# Patient Record
Sex: Female | Born: 1950 | Race: Asian | Hispanic: No | Marital: Single | Smoking: Former smoker
Health system: Southern US, Community
[De-identification: ages and names within clinical notes are randomized; demographics above are authoritative.]

## PROBLEM LIST (undated history)

## (undated) DIAGNOSIS — F32A Depression, unspecified: Secondary | ICD-10-CM

## (undated) DIAGNOSIS — H269 Unspecified cataract: Secondary | ICD-10-CM

## (undated) DIAGNOSIS — M810 Age-related osteoporosis without current pathological fracture: Secondary | ICD-10-CM

## (undated) DIAGNOSIS — F329 Major depressive disorder, single episode, unspecified: Secondary | ICD-10-CM

## (undated) DIAGNOSIS — I10 Essential (primary) hypertension: Secondary | ICD-10-CM

## (undated) HISTORY — DX: Essential (primary) hypertension: I10

## (undated) HISTORY — DX: Depression, unspecified: F32.A

## (undated) HISTORY — DX: Unspecified cataract: H26.9

## (undated) HISTORY — DX: Age-related osteoporosis without current pathological fracture: M81.0

## (undated) HISTORY — DX: Major depressive disorder, single episode, unspecified: F32.9

---

## 2013-11-26 ENCOUNTER — Ambulatory Visit (INDEPENDENT_AMBULATORY_CARE_PROVIDER_SITE_OTHER): Payer: BC Managed Care – PPO | Admitting: Podiatry

## 2013-11-26 ENCOUNTER — Encounter: Payer: Self-pay | Admitting: Podiatry

## 2013-11-26 VITALS — BP 149/100 | HR 82 | Ht <= 58 in | Wt 133.0 lb

## 2013-11-26 DIAGNOSIS — S99919A Unspecified injury of unspecified ankle, initial encounter: Secondary | ICD-10-CM

## 2013-11-26 DIAGNOSIS — M659 Synovitis and tenosynovitis, unspecified: Secondary | ICD-10-CM

## 2013-11-26 DIAGNOSIS — L4050 Arthropathic psoriasis, unspecified: Secondary | ICD-10-CM

## 2013-11-26 DIAGNOSIS — S8990XA Unspecified injury of unspecified lower leg, initial encounter: Secondary | ICD-10-CM

## 2013-11-26 DIAGNOSIS — L409 Psoriasis, unspecified: Secondary | ICD-10-CM | POA: Insufficient documentation

## 2013-11-26 DIAGNOSIS — S99929A Unspecified injury of unspecified foot, initial encounter: Secondary | ICD-10-CM

## 2013-11-26 MED ORDER — TRIAMCINOLONE 0.1 % CREAM:EUCERIN CREAM 1:1: 1.0000 "application " | TOPICAL_CREAM | Freq: Two times a day (BID) | CUTANEOUS | Status: DC

## 2013-11-26 NOTE — Patient Instructions (Signed)
Seen for injury on right foot and ankle. No sign of acute injury in X-ray.  Noted of osteoporosis and arthritic change. Findings discusses.

## 2013-11-26 NOTE — Progress Notes (Signed)
Subjective: Slipped and fell on 11/22/13 while cleaning her car. Right foot turned over and swollen. Was not able to walk immediately after the incident for a few days. Used Ice pack that brought down swelling.  Today she is able to walk.   Objective: Swollen foot and ankle right. Positive pain at the first MPJ, chronic.  Pain is at posterior lateral aspect of right lateral malleoli.  X-ray reveal no acute change in right and left. 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  High arched cavus foot.  Dry yellow crusted.  Assessment: Psoriatic arthritis by history. Dermatitis plantar surface bilateral. Arthropathy first MPJ bilateral.   Plan:

## 2014-08-05 ENCOUNTER — Ambulatory Visit: Payer: Self-pay | Admitting: Internal Medicine

## 2014-09-02 ENCOUNTER — Telehealth: Payer: Self-pay | Admitting: *Deleted

## 2014-09-02 MED ORDER — HYDROCODONE-IBUPROFEN 7.5-200 MG PO TABS: 1.0000 | ORAL_TABLET | Freq: Three times a day (TID) | ORAL | Status: DC | PRN

## 2014-09-02 MED ORDER — CYCLOBENZAPRINE HCL 10 MG PO TABS: 10.0000 mg | ORAL_TABLET | Freq: Three times a day (TID) | ORAL | Status: DC | PRN

## 2014-09-02 MED ORDER — NABUMETONE 500 MG PO TABS: 500.0000 mg | ORAL_TABLET | Freq: Two times a day (BID) | ORAL | Status: DC

## 2014-09-02 NOTE — Telephone Encounter (Signed)
Patient requests pain medication.

## 2014-09-09 ENCOUNTER — Ambulatory Visit: Payer: Self-pay | Admitting: Internal Medicine

## 2014-10-14 ENCOUNTER — Ambulatory Visit: Payer: Self-pay | Admitting: Medical

## 2014-10-21 ENCOUNTER — Telehealth: Payer: Self-pay | Admitting: Medical

## 2014-10-21 ENCOUNTER — Ambulatory Visit (HOSPITAL_BASED_OUTPATIENT_CLINIC_OR_DEPARTMENT_OTHER)
Admission: RE | Admit: 2014-10-21 | Discharge: 2014-10-21 | Disposition: A | Discharge status: Home / Self Care | Payer: 59 | Source: Ambulatory Visit | Attending: Medical | Admitting: Medical

## 2014-10-21 ENCOUNTER — Ambulatory Visit: Payer: Self-pay | Admitting: Internal Medicine

## 2014-10-21 ENCOUNTER — Ambulatory Visit (INDEPENDENT_AMBULATORY_CARE_PROVIDER_SITE_OTHER): Payer: 59 | Admitting: Medical

## 2014-10-21 ENCOUNTER — Encounter: Payer: Self-pay | Admitting: Medical

## 2014-10-21 VITALS — BP 163/90 | HR 106 | Temp 98.3°F | Ht 60.0 in | Wt 129.2 lb

## 2014-10-21 DIAGNOSIS — M25561 Pain in right knee: Secondary | ICD-10-CM | POA: Diagnosis present

## 2014-10-21 DIAGNOSIS — G8929 Other chronic pain: Secondary | ICD-10-CM

## 2014-10-21 DIAGNOSIS — I10 Essential (primary) hypertension: Secondary | ICD-10-CM

## 2014-10-21 LAB — CBC WITH DIFFERENTIAL/PLATELET
BASOS PCT: 0.4 % (ref 0.0–3.0)
Basophils Absolute: 0 10*3/uL (ref 0.0–0.1)
Eosinophils Absolute: 0.1 10*3/uL (ref 0.0–0.7)
Eosinophils Relative: 1 % (ref 0.0–5.0)
HEMATOCRIT: 42.8 % (ref 36.0–46.0)
HEMOGLOBIN: 14.7 g/dL (ref 12.0–15.0)
Lymphocytes Relative: 34.7 % (ref 12.0–46.0)
Lymphs Abs: 3.2 10*3/uL (ref 0.7–4.0)
MCHC: 34.2 g/dL (ref 30.0–36.0)
MCV: 91.5 fl (ref 78.0–100.0)
MONOS PCT: 4.8 % (ref 3.0–12.0)
Monocytes Absolute: 0.4 10*3/uL (ref 0.1–1.0)
Neutro Abs: 5.4 10*3/uL (ref 1.4–7.7)
Neutrophils Relative %: 59.1 % (ref 43.0–77.0)
Platelets: 363 10*3/uL (ref 150.0–400.0)
RBC: 4.68 Mil/uL (ref 3.87–5.11)
RDW: 13.1 % (ref 11.5–15.5)
WBC: 9.2 10*3/uL (ref 4.0–10.5)

## 2014-10-21 LAB — COMPREHENSIVE METABOLIC PANEL
ALK PHOS: 147 U/L — AB (ref 39–117)
ALT: 55 U/L — ABNORMAL HIGH (ref 0–35)
AST: 34 U/L (ref 0–37)
Albumin: 4.2 g/dL (ref 3.5–5.2)
BUN: 9 mg/dL (ref 6–23)
CO2: 27 mEq/L (ref 19–32)
Calcium: 9.9 mg/dL (ref 8.4–10.5)
Chloride: 102 mEq/L (ref 96–112)
Creatinine, Ser: 0.54 mg/dL (ref 0.40–1.20)
GFR: 121.7 mL/min (ref >60.00)
Glucose, Bld: 127 mg/dL — ABNORMAL HIGH (ref 70–99)
Potassium: 3.9 mEq/L (ref 3.5–5.1)
SODIUM: 139 meq/L (ref 135–145)
TOTAL PROTEIN: 8 g/dL (ref 6.0–8.3)
Total Bilirubin: 0.5 mg/dL (ref 0.2–1.2)

## 2014-10-21 MED ORDER — LISINOPRIL 10 MG PO TABS: 10.0000 mg | ORAL_TABLET | Freq: Every day | ORAL | Status: DC

## 2014-10-21 NOTE — Patient Instructions (Addendum)
For your htn, I am prescribing lisinopril and getting labs today.  For your knee pain rt side, I will get xray today. Since you pain is very minimal now I  would only recommend low dose alleve otc 1 tablet twice if pain were to worsen. Stronger med on follow up may be written but want to know your bp is controlled better before prescribing.  Follow up in 2 weeks for blood pressure check and discuss how your knee feels. May give med for knee pain on follow up.

## 2014-10-21 NOTE — Assessment & Plan Note (Signed)
For your htn, I am prescribing lisinopril and getting labs today.

## 2014-10-21 NOTE — Progress Notes (Signed)
Pre visit review using our clinic review tool, if applicable. No additional management support is needed unless otherwise documented below in the visit note. 

## 2014-10-21 NOTE — Telephone Encounter (Signed)
I have reviewed pt labs. Mild abnormality. Will review with her when her translator is in. Pt speaks [place]. When she is in for follow up will discuss labs and x-rays with pt.

## 2014-10-21 NOTE — Progress Notes (Signed)
Subjective:    Patient ID: Barbara Buchanan, female    DOB: [birth year], 64 y.o.   MRN: [id number]  HPI   I have reviewed pt PMH, PSH, FH, Social History and Surgical History.(pt friend translates). Speaks [place].  Depression- hx of. She states it is controlled. Had that years ago. 2 yrs ago.  Htn- BP  high today. She never been on medication for bp before.  BP at podiatrist was 149/100. No cardiac or neurologic signs or symptoms.   Osteoporosis- Pt not aware of any medications in the past.  No history of surgeries before.  Former smoker- quit 5 yrs ago. 2 pack a day before she quit.  Pt drinks some alcohol. Hard  about twice a week. Very rare now . 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 Pt states [facility]. Then she saw orhtopedist . They gave her a injection in knee. She did Jalana with minimal to no pain up to 1 wk ago when pain seems to restart. The pain is much less than before.    Pt not taking any rx medications know.  Past Medical History  Diagnosis Date  . Cataract   . Depression   . Hypertension   . Osteoporosis     History   Social History  . Marital Status: Single    Spouse Name: N/A    Number of Children: N/A  . Years of Education: N/A   Occupational History  . Not on file.   Social History Main Topics  . Smoking status: Former Smoker    Quit date: 10/21/2009  . Smokeless tobacco: Never Used  . Alcohol Use: Yes  . Drug Use: No  . Sexual Activity: Not on file   Other Topics Concern  . Not on file   Social History Narrative    No past surgical history on file.  Family History  Problem Relation Age of Onset  . Stroke Mother     Allergies  Allergen Reactions  . Penicillins     No current outpatient prescriptions on file prior to visit.   No current  facility-administered medications on file prior to visit.    BP 163/90 mmHg  Pulse 106  Temp(Src) 98.3 F (36.8 C) (Oral)  Ht 5' (1.524 m)  Wt 129 lb 3.2 oz (58.605 kg)  BMI 25.23 kg/m2  SpO2 95%          Review of Systems  Constitutional: Negative for fever, chills, diaphoresis, activity change and fatigue.  Respiratory: Negative for cough, chest tightness and shortness of breath.   Cardiovascular: Negative for chest pain, palpitations and leg swelling.  Gastrointestinal: Negative for nausea, vomiting and abdominal pain.  Musculoskeletal: Negative for neck pain and neck stiffness.  Neurological: Negative for dizziness, tremors, seizures, syncope, facial asymmetry, speech difficulty, weakness, light-headedness, numbness and headaches.  Psychiatric/Behavioral: Negative for behavioral problems, confusion and agitation. The patient is not nervous/anxious.        Objective:   Physical Exam   General Mental Status- Alert. General Appearance- Not in acute distress.   Skin General: Color- Normal Color. Moisture- Normal Moisture.  Neck Carotid Arteries- Normal color. Moisture- Normal Moisture. No carotid bruits. No JVD.  Chest and Lung Exam Auscultation: Breath Sounds:-Normal.  Cardiovascular Auscultation:Rythm- Regular. Murmurs & Other Heart Sounds:Auscultation of the heart reveals- No  Murmurs.  Abdomen Inspection:-Inspeection Normal. Palpation/Percussion:Note:No mass. Palpation and Percussion of the abdomen reveal- Non Tender, Non Distended + BS, no rebound or guarding.    Neurologic Cranial Nerve exam:- CN III-XII intact(No nystagmus), symmetric smile. Romberg Exam:- Negative.  Heal to Toe Gait exam:-Normal. Strength:- 5/5 equal and symmetric strength both upper and lower extremities.  Rt and lt knee- neither has crepitus, swelling or tenderness on palpation or range of motion today.        Assessment & Plan:

## 2014-10-21 NOTE — Assessment & Plan Note (Signed)
For your knee pain rt side, I will get xray today. Since you pain is very minimal now I  would only recommend low dose alleve otc 1 tablet twice if pain were to worsen. Stronger med on follow up may be written but want to know your bp is controlled better before prescribing.

## 2014-11-04 ENCOUNTER — Telehealth: Payer: Self-pay | Admitting: Medical

## 2014-11-04 ENCOUNTER — Ambulatory Visit (INDEPENDENT_AMBULATORY_CARE_PROVIDER_SITE_OTHER): Payer: 59 | Admitting: Medical

## 2014-11-04 ENCOUNTER — Encounter: Payer: Self-pay | Admitting: Medical

## 2014-11-04 VITALS — BP 137/80 | HR 93 | Temp 97.7°F | Wt 128.8 lb

## 2014-11-04 DIAGNOSIS — R739 Hyperglycemia, unspecified: Secondary | ICD-10-CM

## 2014-11-04 DIAGNOSIS — M79671 Pain in right foot: Secondary | ICD-10-CM

## 2014-11-04 DIAGNOSIS — I10 Essential (primary) hypertension: Secondary | ICD-10-CM

## 2014-11-04 DIAGNOSIS — Z1211 Encounter for screening for malignant neoplasm of colon: Secondary | ICD-10-CM

## 2014-11-04 DIAGNOSIS — M25561 Pain in right knee: Secondary | ICD-10-CM

## 2014-11-04 DIAGNOSIS — R61 Generalized hyperhidrosis: Secondary | ICD-10-CM

## 2014-11-04 MED ORDER — LISINOPRIL 10 MG PO TABS: 10.0000 mg | ORAL_TABLET | Freq: Every day | ORAL | Status: DC

## 2014-11-04 NOTE — Assessment & Plan Note (Signed)
Intermittent episodes with subjective fever. No obvious infectious signs or symptoms. No recorded fever. Pt denies hot flash since went through menopause. I want her to get thermometer and check t with these events. Will refer her for colnoscopy. screening. Schedule wellness exam in 3-4 weeks. Get pap and order mammogram. Explained to pt and friend if indeed has fevers the will need to expand work up. Recent cbc and cmp looked [facility].

## 2014-11-04 NOTE — Telephone Encounter (Signed)
Pt prefers [facility] for gastro referral

## 2014-11-04 NOTE — Assessment & Plan Note (Signed)
a1c today 

## 2014-11-04 NOTE — Telephone Encounter (Signed)
Referral faxed to [facility]/awaiting appt °

## 2014-11-04 NOTE — Progress Notes (Signed)
Pre visit review using our clinic review tool, if applicable. No additional management support is needed unless otherwise documented below in the visit note. 

## 2014-11-04 NOTE — Patient Instructions (Addendum)
Essential hypertension I am pleased with reading I got today in light of prior reading. Will refill lisinopril at the same dose.    Knee pain, right Resolved.   Tenosynovitis of foot and ankle Pt states seen by podiatry. Known spur. Pain persists despite heel pad and meloxicam. I advised her to to see podiatrist again for possible procedure.   Diaphoresis Intermittent episodes with subjective fever. No obvious infectious signs or symptoms. No recorded fever. Pt denies hot flash since went through menopause. I want her to get thermometer and check t with these events. Will refer her for colnoscopy. screening. Schedule wellness exam in 3-4 weeks. Get pap and order mammogram. Explained to pt and friend if indeed has fevers the will need to expand work up. Recent cbc and cmp looked Barbara Buchanan.   Hyperglycemia a1-c today.   Folloq up 3-4 weeks wellness exam.

## 2014-11-04 NOTE — Progress Notes (Signed)
Subjective:    Patient ID: Barbara Buchanan, female    DOB: 12-12-1950, 64 y.o.   MRN: [id number]  HPI   Pt in for bp check. Pt is taking bp medication every day. Pt has not been checking her bp daily. No chest pain or neurologic signs or symptoms. No side effects from the lisinopril reported.  Pt no longer has knee pain. Rt knee pain resolved. But some heel pain about one week. No fall or trauma.  Also medial aspect of rt heal. Pt states foot Dr. Did xray of foot. Pt told she had spurr on xray. Pt was given meloxicam. Pt using heal pads.  Pt has some has acute sweat on and off 3- times a day. Pt states years ago went through menopause. This has been going on past month. No recorded fever and on review no infectious symptoms.   Refer to Gi.   Pt never had pap smear. No mammogram.     Review of Systems  Constitutional: Positive for diaphoresis. Negative for fever, chills, activity change and fatigue.  Respiratory: Negative for cough, chest tightness and shortness of breath.   Cardiovascular: Negative for chest pain, palpitations and leg swelling.  Gastrointestinal: Negative for nausea, vomiting and abdominal pain.  Musculoskeletal: Negative for neck pain and neck stiffness.       Rt knee pain resolved. Persistent rt heel pain.  Neurological: Negative for dizziness, tremors, seizures, syncope, facial asymmetry, speech difficulty, weakness, light-headedness, numbness and headaches.  Psychiatric/Behavioral: Negative for behavioral problems, confusion and agitation. The patient is not nervous/anxious.    Past Medical History  Diagnosis Date  . Cataract   . Depression   . Hypertension   . Osteoporosis     History   Social History  . Marital Status: Single    Spouse Name: N/A    Number of Children: N/A  . Years of Education: N/A   Occupational History  . Not on file.   Social History Main Topics  . Smoking status: Former Smoker    Quit date: 10/21/2009  . Smokeless tobacco: Never  Used  . Alcohol Use: Yes  . Drug Use: No  . Sexual Activity: Not on file   Other Topics Concern  . Not on file   Social History Narrative    No past surgical history on file.  Family History  Problem Relation Age of Onset  . Stroke Mother     Allergies  Allergen Reactions  . Penicillins     No current outpatient prescriptions on file prior to visit.   No current facility-administered medications on file prior to visit.    BP 137/80 mmHg  Pulse 93  Temp(Src) 97.7 F (36.5 C) (Oral)  Wt 128 lb 12.8 oz (58.423 kg)  SpO2 96%       Objective:   Physical Exam  General Mental Status- Alert. General Appearance- Not in acute distress.   Skin General: Color- Normal Color. Moisture- Normal Moisture.  Neck Carotid Arteries- Normal color. Moisture- Normal Moisture. No carotid bruits. No JVD.  Chest and Lung Exam Auscultation: Breath Sounds:-Normal.  Cardiovascular Auscultation:Rythm- Regular. Murmurs & Other Heart Sounds:Auscultation of the heart reveals- No Murmurs.  Abdomen Inspection:-Inspeection Normal. Palpation/Percussion:Note:No mass. Palpation and Percussion of the abdomen reveal- Non Tender, Non Distended + BS, no rebound or guarding.    Neurologic Cranial Nerve exam:- CN III-XII intact(No nystagmus), symmetric smile. Drift Test:- No drift. Romberg Exam:- Negative.  Heal to Toe Gait exam:-Normal. Finger to Nose:- Normal/Intact Strength:- 5/5 equal  and symmetric strength both upper and lower extremities.  Rt knee- from, no crepitus. No pain on rom. No isntabliltiy  Rt foot- faint heal pain presently.(no redness or warmth)_     Assessment & Plan:

## 2014-11-04 NOTE — Assessment & Plan Note (Signed)
Pt states seen by podiatry. Known spur. Pain persists despite heel pad and meloxicam. I advised her to to see podiatrist again for possible procedure.

## 2014-11-04 NOTE — Assessment & Plan Note (Signed)
I am pleased with reading I got today in light of prior reading. Will refill lisinopril at the same dose.

## 2014-11-04 NOTE — Assessment & Plan Note (Signed)
Resolved

## 2014-11-26 LAB — HM COLONOSCOPY: HM Colonoscopy: NORMAL

## 2014-12-09 ENCOUNTER — Encounter: Payer: Self-pay | Admitting: Medical

## 2014-12-09 ENCOUNTER — Other Ambulatory Visit (HOSPITAL_COMMUNITY)
Admission: RE | Admit: 2014-12-09 | Discharge: 2014-12-09 | Disposition: A | Discharge status: Home / Self Care | Payer: 59 | Source: Ambulatory Visit | Attending: Medical | Admitting: Medical

## 2014-12-09 ENCOUNTER — Ambulatory Visit (INDEPENDENT_AMBULATORY_CARE_PROVIDER_SITE_OTHER): Payer: 59 | Admitting: Medical

## 2014-12-09 VITALS — BP 144/66 | HR 91 | Temp 98.0°F | Ht 59.0 in | Wt 124.6 lb

## 2014-12-09 DIAGNOSIS — Z0000 Encounter for general adult medical examination without abnormal findings: Secondary | ICD-10-CM | POA: Diagnosis not present

## 2014-12-09 DIAGNOSIS — Z1239 Encounter for other screening for malignant neoplasm of breast: Secondary | ICD-10-CM

## 2014-12-09 DIAGNOSIS — Z87891 Personal history of nicotine dependence: Secondary | ICD-10-CM

## 2014-12-09 DIAGNOSIS — Z720 Tobacco use: Secondary | ICD-10-CM | POA: Diagnosis not present

## 2014-12-09 DIAGNOSIS — Z23 Encounter for immunization: Secondary | ICD-10-CM | POA: Diagnosis not present

## 2014-12-09 DIAGNOSIS — Z113 Encounter for screening for infections with a predominantly sexual mode of transmission: Secondary | ICD-10-CM | POA: Diagnosis not present

## 2014-12-09 DIAGNOSIS — Z124 Encounter for screening for malignant neoplasm of cervix: Secondary | ICD-10-CM | POA: Diagnosis not present

## 2014-12-09 DIAGNOSIS — Z01419 Encounter for gynecological examination (general) (routine) without abnormal findings: Secondary | ICD-10-CM | POA: Diagnosis not present

## 2014-12-09 DIAGNOSIS — N390 Urinary tract infection, site not specified: Secondary | ICD-10-CM

## 2014-12-09 DIAGNOSIS — R82998 Other abnormal findings in urine: Secondary | ICD-10-CM

## 2014-12-09 LAB — POCT URINALYSIS DIPSTICK
Bilirubin, UA: NEGATIVE
Blood, UA: NEGATIVE
Glucose, UA: NEGATIVE
Ketones, UA: NEGATIVE
Nitrite, UA: NEGATIVE
PROTEIN UA: NEGATIVE
SPEC GRAV UA: 1.015
Urobilinogen, UA: 0.2
pH, UA: 5

## 2014-12-09 NOTE — Progress Notes (Signed)
   Subjective:    Patient ID: Barbara Buchanan, female    DOB: [birth year], 64 y.o.   MRN: [id number]  HPI   Pt in and is fasting for wellness exam.  Pt is exercising more. She is walking more recently. Stretches.1 cups of coffee. No longer smoker(about 5 years ago stopped ).(Smoked for 30 yrs 2 packs a day) Pt single. Has one chid.  Pt never had pap smear ever.  Pt has a lot of watery itchy eyes daily.(2 month recently.) She thinks not allergies. She has seen specialist for this before. Pt has seen ophthalmologist in the past for this Dr. Donna Bernardleaveland DO [phone].    Review of Systems  Constitutional: Negative for fever, chills and fatigue.  HENT: Negative for congestion, drooling, ear discharge, facial swelling, hearing loss, mouth sores, rhinorrhea, sinus pressure, sore throat, tinnitus and voice change.   Eyes: Positive for redness and itching.       Watery eyes for past 2 months.  Pt still has some symptoms.  Respiratory: Negative for cough, choking, chest tightness, shortness of breath and wheezing.   Cardiovascular: Negative for chest pain and palpitations.  Gastrointestinal: Negative for abdominal pain, diarrhea, constipation, blood in stool, abdominal distention, anal bleeding and rectal pain.  Musculoskeletal: Negative for back pain.  Neurological: Negative for dizziness, seizures, syncope, speech difficulty, weakness, light-headedness and headaches.  Hematological: Negative for adenopathy. Does not bruise/bleed easily.       Objective:   Physical Exam  General   Mental Status- Alert.  Orientation-Oriented x3. Build and Nutrition Well Nourished and Well Developed.  Skin General: Normal.  Color- Normal color. Moisture- Dry.Temperature warm. Lesions: No suspicious lesions  Head, Eyes, Ears, Nose, Thoat Ears-Normal. Auditory Canal-Bilateral-Normal. Tympanic Membrane- Bilateral-Normal. Eyes Fundi- Bilateral-Normal. Pupil- Bilateral- Direct reaction to light normal. Nose &  Sinuses- Normal. Nostril- Bilateral-Normal.  Neck Neck- No Bruits or Masses. Thyroid- Normal. No thyromegaly or nodules.  Breast Breast Lump: No palpable masses, symmetric, no axillary lymphadenopathy palpated.  Chest and Lung Exam  Percussion: Quality and Intensity:-Percussion normal. Percussion of chest reveals- No Dullness. Palpation of the chest reveals- Non-tender. Auscultation: Breath sounds-Normal. Adventitious  Sounds:No adventitious   Vaginal External: Labia majora and minora normal/no lesions. Pelvic/Bimanual exam: Cervical OS not red or friable. No discharge. No cervical motion tenderness. No masses felt on palpation of adnexal regions. Cardiovascular Inspection: No Heaves. Auscultation: Heart Sounds- Normal sinus rhythm without murmur or gallop, S1 WNL and S2 WNL.  Abdomen Inspection:- Inspection Normal. Inspection of abdomen reveals- No Hernias. Palpation/Percussion: Palpation and Percussion of the abdomen reveal- Non Tender and No Palpable masses. Liver: Other Characteristics- No Hepatmegaly Spleen:Other Characteristics- No Splenomegaly. Auscultation: Auscultation of the abdomen reveals-Bowel sounds normal and No Abdominal bruits.    Neurologic Mental Status- Normal Cranial Nerves- Normal Bilaterally, Motor- Normal. Strength:5/5 normal muscle strength- All Muscles. General Assessment of Reflexes- Right Knee- 2+. Left Knee- 2+. Coordination- Normal. Gait- Normal. Meningeal Signs- None.  Musculoskeletal Global Assessment General- Joints show full range of motion without obvious deformity and Normal muscle mass. Strength 5/5 in upper and lower extremities.  Lymphatic General lymphatics Description-No Generalized lymphadenopathy.        Assessment & Plan:

## 2014-12-09 NOTE — Assessment & Plan Note (Addendum)
Cbc, cmp, tsh, lipid panel, ua, pap smear, mammogram order. Screening ct of chest without.  Contrast. Colonoscopy referral.  Tdap. Fluvaccine.   Please sign release of information so we can review eye treatment. Can refer you to different eye doctor if you prefer.  Follow up as needed post labs. Will call you on when to follow up as well

## 2014-12-09 NOTE — Progress Notes (Signed)
Pre visit review using our clinic review tool, if applicable. No additional management support is needed unless otherwise documented below in the visit note. 

## 2014-12-09 NOTE — Patient Instructions (Addendum)
Wellness examination Cbc, cmp, tsh, lipid panel, ua, pap smear, mammogram order. Screening ct of chest without.  Contrast. Colonoscopy referral.  Tdap. Fluvaccine.   Please sign release of information so we can review eye treatment. Can refer you to different eye doctor if you prefer.  Follow up as needed post labs. Will call you on when to follow up as well     Preventive Care for Adults A healthy lifestyle and preventive care can promote health and wellness. Preventive health guidelines for women include the following key practices.  A routine yearly physical is a good way to check with your health care provider about your health and preventive screening. It is a chance to share any concerns and updates on your health and to receive a thorough exam.  Visit your dentist for a routine exam and preventive care every 6 months. Brush your teeth twice a day and floss once a day. Good oral hygiene prevents tooth decay and gum disease.  The frequency of eye exams is based on your age, health, family medical history, use of contact lenses, and other factors. Follow your health care provider's recommendations for frequency of eye exams.  Eat a healthy diet. Foods like vegetables, fruits, whole grains, low-fat dairy products, and lean protein foods contain the nutrients you need without too many calories. Decrease your intake of foods high in solid fats, added sugars, and salt. Eat the right amount of calories for you.Get information about a proper diet from your health care provider, if necessary.  Regular physical exercise is one of the most important things you can do for your health. Most adults should get at least 150 minutes of moderate-intensity exercise (any activity that increases your heart rate and causes you to sweat) each week. In addition, most adults need muscle-strengthening exercises on 2 or more days a week.  Maintain a healthy weight. The body mass index (BMI) is a screening tool  to identify possible weight problems. It provides an estimate of body fat based on height and weight. Your health care provider can find your BMI and can help you achieve or maintain a healthy weight.For adults 20 years and older:  A BMI below 18.5 is considered underweight.  A BMI of 18.5 to 24.9 is normal.  A BMI of 25 to 29.9 is considered overweight.  A BMI of 30 and above is considered obese.  Maintain normal blood lipids and cholesterol levels by exercising and minimizing your intake of saturated fat. Eat a balanced diet with plenty of fruit and vegetables. Blood tests for lipids and cholesterol should begin at age 69 and be repeated every 5 years. If your lipid or cholesterol levels are high, you are over 50, or you are at high risk for heart disease, you may need your cholesterol levels checked more frequently.Ongoing high lipid and cholesterol levels should be treated with medicines if diet and exercise are not working.  If you smoke, find out from your health care provider how to quit. If you do not use tobacco, do not start.  Lung cancer screening is recommended for adults aged 26-80 years who are at high risk for developing lung cancer because of a history of smoking. A yearly low-dose CT scan of the lungs is recommended for people who have at least a 30-pack-year history of smoking and are a current smoker or have quit within the past 15 years. A pack year of smoking is smoking an average of 1 pack of cigarettes a day for  1 year (for example: 1 pack a day for 30 years or 2 packs a day for 15 years). Yearly screening should continue until the smoker has stopped smoking for at least 15 years. Yearly screening should be stopped for people who develop a health problem that would prevent them from having lung cancer treatment.  If you are pregnant, do not drink alcohol. If you are breastfeeding, be very cautious about drinking alcohol. If you are not pregnant and choose to drink alcohol, do  not have more than 1 drink per day. One drink is considered to be 12 ounces (355 mL) of beer, 5 ounces (148 mL) of wine, or 1.5 ounces (44 mL) of liquor.  Avoid use of street drugs. Do not share needles with anyone. Ask for help if you need support or instructions about stopping the use of drugs.  High blood pressure causes heart disease and increases the risk of stroke. Your blood pressure should be checked at least every 1 to 2 years. Ongoing high blood pressure should be treated with medicines if weight loss and exercise do not work.  If you are 25-71 years old, ask your health care provider if you should take aspirin to prevent strokes.  Diabetes screening involves taking a blood sample to check your fasting blood sugar level. This should be done once every 3 years, after age 15, if you are within normal weight and without risk factors for diabetes. Testing should be considered at a younger age or be carried out more frequently if you are overweight and have at least 1 risk factor for diabetes.  Breast cancer screening is essential preventive care for women. You should practice "breast self-awareness." This means understanding the normal appearance and feel of your breasts and may include breast self-examination. Any changes detected, no matter how small, should be reported to a health care provider. Women in their 66s and 30s should have a clinical breast exam (CBE) by a health care provider as part of a regular health exam every 1 to 3 years. After age 6, women should have a CBE every year. Starting at age 66, women should consider having a mammogram (breast X-ray test) every year. Women who have a family history of breast cancer should talk to their health care provider about genetic screening. Women at a high risk of breast cancer should talk to their health care providers about having an MRI and a mammogram every year.  Breast cancer gene (BRCA)-related cancer risk assessment is recommended for  women who have family members with BRCA-related cancers. BRCA-related cancers include breast, ovarian, tubal, and peritoneal cancers. Having family members with these cancers may be associated with an increased risk for harmful changes (mutations) in the breast cancer genes BRCA1 and BRCA2. Results of the assessment will determine the need for genetic counseling and BRCA1 and BRCA2 testing.  Routine pelvic exams to screen for cancer are no longer recommended for nonpregnant women who are considered low risk for cancer of the pelvic organs (ovaries, uterus, and vagina) and who do not have symptoms. Ask your health care provider if a screening pelvic exam is right for you.  If you have had past treatment for cervical cancer or a condition that could lead to cancer, you need Pap tests and screening for cancer for at least 20 years after your treatment. If Pap tests have been discontinued, your risk factors (such as having a new sexual partner) need to be reassessed to determine if screening should be resumed. Some  women have medical problems that increase the chance of getting cervical cancer. In these cases, your health care provider may recommend more frequent screening and Pap tests.  The HPV test is an additional test that may be used for cervical cancer screening. The HPV test looks for the virus that can cause the cell changes on the cervix. The cells collected during the Pap test can be tested for HPV. The HPV test could be used to screen women aged 101 years and older, and should be used in women of any age who have unclear Pap test results. After the age of 74, women should have HPV testing at the same frequency as a Pap test.  Colorectal cancer can be detected and often prevented. Most routine colorectal cancer screening begins at the age of 80 years and continues through age 79 years. However, your health care provider may recommend screening at an earlier age if you have risk factors for colon  cancer. On a yearly basis, your health care provider may provide home test kits to check for hidden blood in the stool. Use of a small camera at the end of a tube, to directly examine the colon (sigmoidoscopy or colonoscopy), can detect the earliest forms of colorectal cancer. Talk to your health care provider about this at age 32, when routine screening begins. Direct exam of the colon should be repeated every 5-10 years through age 27 years, unless early forms of pre-cancerous polyps or small growths are found.  People who are at an increased risk for hepatitis B should be screened for this virus. You are considered at high risk for hepatitis B if:  You were born in a country where hepatitis B occurs often. Talk with your health care provider about which countries are considered high risk.  Your parents were born in a high-risk country and you have not received a shot to protect against hepatitis B (hepatitis B vaccine).  You have HIV or AIDS.  You use needles to inject street drugs.  You live with, or have sex with, someone who has hepatitis B.  You get hemodialysis treatment.  You take certain medicines for conditions like cancer, organ transplantation, and autoimmune conditions.  Hepatitis C blood testing is recommended for all people born from [birth year] through 1965 and any individual with known risks for hepatitis C.  Practice safe sex. Use condoms and avoid high-risk sexual practices to reduce the spread of sexually transmitted infections (STIs). STIs include gonorrhea, chlamydia, syphilis, trichomonas, herpes, HPV, and human immunodeficiency virus (HIV). Herpes, HIV, and HPV are viral illnesses that have no cure. They can result in disability, cancer, and death.  You should be screened for sexually transmitted illnesses (STIs) including gonorrhea and chlamydia if:  You are sexually active and are younger than 24 years.  You are older than 24 years and your health care provider tells  you that you are at risk for this type of infection.  Your sexual activity has changed since you were last screened and you are at an increased risk for chlamydia or gonorrhea. Ask your health care provider if you are at risk.  If you are at risk of being infected with HIV, it is recommended that you take a prescription medicine daily to prevent HIV infection. This is called preexposure prophylaxis (PrEP). You are considered at risk if:  You are a heterosexual woman, are sexually active, and are at increased risk for HIV infection.  You take drugs by injection.  You are sexually  active with a partner who has HIV.  Talk with your health care provider about whether you are at high risk of being infected with HIV. If you choose to begin PrEP, you should first be tested for HIV. You should then be tested every 3 months for as long as you are taking PrEP.  Osteoporosis is a disease in which the bones lose minerals and strength with aging. This can result in serious bone fractures or breaks. The risk of osteoporosis can be identified using a bone density scan. Women ages 23 years and over and women at risk for fractures or osteoporosis should discuss screening with their health care providers. Ask your health care provider whether you should take a calcium supplement or vitamin D to reduce the rate of osteoporosis.  Menopause can be associated with physical symptoms and risks. Hormone replacement therapy is available to decrease symptoms and risks. You should talk to your health care provider about whether hormone replacement therapy is right for you.  Use sunscreen. Apply sunscreen liberally and repeatedly throughout the day. You should seek shade when your shadow is shorter than you. Protect yourself by wearing long sleeves, pants, a wide-brimmed hat, and sunglasses year round, whenever you are outdoors.  Once a month, do a whole body skin exam, using a mirror to look at the skin on your back. Tell  your health care provider of new moles, moles that have irregular borders, moles that are larger than a pencil eraser, or moles that have changed in shape or color.  Stay current with required vaccines (immunizations).  Influenza vaccine. All adults should be immunized every year.  Tetanus, diphtheria, and acellular pertussis (Td, Tdap) vaccine. Pregnant women should receive 1 dose of Tdap vaccine during each pregnancy. The dose should be obtained regardless of the length of time since the last dose. Immunization is preferred during the 27th-36th week of gestation. An adult who has not previously received Tdap or who does not know her vaccine status should receive 1 dose of Tdap. This initial dose should be followed by tetanus and diphtheria toxoids (Td) booster doses every 10 years. Adults with an unknown or incomplete history of completing a 3-dose immunization series with Td-containing vaccines should begin or complete a primary immunization series including a Tdap dose. Adults should receive a Td booster every 10 years.  Varicella vaccine. An adult without evidence of immunity to varicella should receive 2 doses or a second dose if she has previously received 1 dose. Pregnant females who do not have evidence of immunity should receive the first dose after pregnancy. This first dose should be obtained before leaving the health care facility. The second dose should be obtained 4-8 weeks after the first dose.  Human papillomavirus (HPV) vaccine. Females aged 13-26 years who have not received the vaccine previously should obtain the 3-dose series. The vaccine is not recommended for use in pregnant females. However, pregnancy testing is not needed before receiving a dose. If a female is found to be pregnant after receiving a dose, no treatment is needed. In that case, the remaining doses should be delayed until after the pregnancy. Immunization is recommended for any person with an immunocompromised  condition through the age of 36 years if she did not get any or all doses earlier. During the 3-dose series, the second dose should be obtained 4-8 weeks after the first dose. The third dose should be obtained 24 weeks after the first dose and 16 weeks after the second dose.  Zoster vaccine. One dose is recommended for adults aged 25 years or older unless certain conditions are present.  Measles, mumps, and rubella (MMR) vaccine. Adults born before [birth year] generally are considered immune to measles and mumps. Adults born in [birth year] or later should have 1 or more doses of MMR vaccine unless there is a contraindication to the vaccine or there is laboratory evidence of immunity to each of the three diseases. A routine second dose of MMR vaccine should be obtained at least 28 days after the first dose for students attending postsecondary schools, health care workers, or international travelers. People who received inactivated measles vaccine or an unknown type of measles vaccine during 1963-1967 should receive 2 doses of MMR vaccine. People who received inactivated mumps vaccine or an unknown type of mumps vaccine before 1979 and are at high risk for mumps infection should consider immunization with 2 doses of MMR vaccine. For females of childbearing age, rubella immunity should be determined. If there is no evidence of immunity, females who are not pregnant should be vaccinated. If there is no evidence of immunity, females who are pregnant should delay immunization until after pregnancy. Unvaccinated health care workers born before [birth year] who lack laboratory evidence of measles, mumps, or rubella immunity or laboratory confirmation of disease should consider measles and mumps immunization with 2 doses of MMR vaccine or rubella immunization with 1 dose of MMR vaccine.  Pneumococcal 13-valent conjugate (PCV13) vaccine. When indicated, a person who is uncertain of her immunization history and has no record of immunization  should receive the PCV13 vaccine. An adult aged 6 years or older who has certain medical conditions and has not been previously immunized should receive 1 dose of PCV13 vaccine. This PCV13 should be followed with a dose of pneumococcal polysaccharide (PPSV23) vaccine. The PPSV23 vaccine dose should be obtained at least 8 weeks after the dose of PCV13 vaccine. An adult aged 28 years or older who has certain medical conditions and previously received 1 or more doses of PPSV23 vaccine should receive 1 dose of PCV13. The PCV13 vaccine dose should be obtained 1 or more years after the last PPSV23 vaccine dose.  Pneumococcal polysaccharide (PPSV23) vaccine. When PCV13 is also indicated, PCV13 should be obtained first. All adults aged 48 years and older should be immunized. An adult younger than age 53 years who has certain medical conditions should be immunized. Any person who resides in a nursing home or long-term care facility should be immunized. An adult smoker should be immunized. People with an immunocompromised condition and certain other conditions should receive both PCV13 and PPSV23 vaccines. People with human immunodeficiency virus (HIV) infection should be immunized as soon as possible after diagnosis. Immunization during chemotherapy or radiation therapy should be avoided. Routine use of PPSV23 vaccine is not recommended for American Indians, [place] Natives, or people younger than 65 years unless there are medical conditions that require PPSV23 vaccine. When indicated, people who have unknown immunization and have no record of immunization should receive PPSV23 vaccine. One-time revaccination 5 years after the first dose of PPSV23 is recommended for people aged 19-64 years who have chronic kidney failure, nephrotic syndrome, asplenia, or immunocompromised conditions. People who received 1-2 doses of PPSV23 before age 38 years should receive another dose of PPSV23 vaccine at age 42 years or later if at  least 5 years have passed since the previous dose. Doses of PPSV23 are not needed for people immunized with PPSV23 at or after age 74 years.  Meningococcal  vaccine. Adults with asplenia or persistent complement component deficiencies should receive 2 doses of quadrivalent meningococcal conjugate (MenACWY-D) vaccine. The doses should be obtained at least 2 months apart. Microbiologists working with certain meningococcal bacteria, [employer] recruits, people at risk during an outbreak, and people who travel to or live in countries with a high rate of meningitis should be immunized. A first-year college student up through age 15 years who is living in a residence hall should receive a dose if she did not receive a dose on or after her 16th birthday. Adults who have certain high-risk conditions should receive one or more doses of vaccine.  Hepatitis A vaccine. Adults who wish to be protected from this disease, have certain high-risk conditions, work with hepatitis A-infected animals, work in hepatitis A research labs, or travel to or work in countries with a high rate of hepatitis A should be immunized. Adults who were previously unvaccinated and who anticipate close contact with an international adoptee during the first 60 days after arrival in the [place] from a country with a high rate of hepatitis A should be immunized.  Hepatitis B vaccine. Adults who wish to be protected from this disease, have certain high-risk conditions, may be exposed to blood or other infectious body fluids, are household contacts or sex partners of hepatitis B positive people, are clients or workers in certain care facilities, or travel to or work in countries with a high rate of hepatitis B should be immunized.  Haemophilus influenzae type b (Hib) vaccine. A previously unvaccinated person with asplenia or sickle cell disease or having a scheduled splenectomy should receive 1 dose of Hib vaccine. Regardless of previous  immunization, a recipient of a hematopoietic stem cell transplant should receive a 3-dose series 6-12 months after her successful transplant. Hib vaccine is not recommended for adults with HIV infection. Preventive Services / Frequency Ages 55 to 63 years  Blood pressure check.** / Every 1 to 2 years.  Lipid and cholesterol check.** / Every 5 years beginning at age 72.  Clinical breast exam.** / Every 3 years for women in their 66s and 52s.  BRCA-related cancer risk assessment.** / For women who have family members with a BRCA-related cancer (breast, ovarian, tubal, or peritoneal cancers).  Pap test.** / Every 2 years from ages 75 through 28. Every 3 years starting at age 43 through age 23 or 79 with a history of 3 consecutive normal Pap tests.  HPV screening.** / Every 3 years from ages 1 through ages 10 to 70 with a history of 3 consecutive normal Pap tests.  Hepatitis C blood test.** / For any individual with known risks for hepatitis C.  Skin self-exam. / Monthly.  Influenza vaccine. / Every year.  Tetanus, diphtheria, and acellular pertussis (Tdap, Td) vaccine.** / Consult your health care provider. Pregnant women should receive 1 dose of Tdap vaccine during each pregnancy. 1 dose of Td every 10 years.  Varicella vaccine.** / Consult your health care provider. Pregnant females who do not have evidence of immunity should receive the first dose after pregnancy.  HPV vaccine. / 3 doses over 6 months, if 22 and younger. The vaccine is not recommended for use in pregnant females. However, pregnancy testing is not needed before receiving a dose.  Measles, mumps, rubella (MMR) vaccine.** / You need at least 1 dose of MMR if you were born in 1957 or later. You may also need a 2nd dose. For females of childbearing age, rubella immunity should be  determined. If there is no evidence of immunity, females who are not pregnant should be vaccinated. If there is no evidence of immunity, females who  are pregnant should delay immunization until after pregnancy.  Pneumococcal 13-valent conjugate (PCV13) vaccine.** / Consult your health care provider.  Pneumococcal polysaccharide (PPSV23) vaccine.** / 1 to 2 doses if you smoke cigarettes or if you have certain conditions.  Meningococcal vaccine.** / 1 dose if you are age 60 to 33 years and a Market researcher living in a residence hall, or have one of several medical conditions, you need to get vaccinated against meningococcal disease. You may also need additional booster doses.  Hepatitis A vaccine.** / Consult your health care provider.  Hepatitis B vaccine.** / Consult your health care provider.  Haemophilus influenzae type b (Hib) vaccine.** / Consult your health care provider. Ages 7 to 55 years  Blood pressure check.** / Every 1 to 2 years.  Lipid and cholesterol check.** / Every 5 years beginning at age 90 years.  Lung cancer screening. / Every year if you are aged 16-80 years and have a 30-pack-year history of smoking and currently smoke or have quit within the past 15 years. Yearly screening is stopped once you have quit smoking for at least 15 years or develop a health problem that would prevent you from having lung cancer treatment.  Clinical breast exam.** / Every year after age 50 years.  BRCA-related cancer risk assessment.** / For women who have family members with a BRCA-related cancer (breast, ovarian, tubal, or peritoneal cancers).  Mammogram.** / Every year beginning at age 53 years and continuing for as long as you are in good health. Consult with your health care provider.  Pap test.** / Every 3 years starting at age 63 years through age 32 or 79 years with a history of 3 consecutive normal Pap tests.  HPV screening.** / Every 3 years from ages 65 years through ages 52 to 57 years with a history of 3 consecutive normal Pap tests.  Fecal occult blood test (FOBT) of stool. / Every year beginning at age  4 years and continuing until age 62 years. You may not need to do this test if you get a colonoscopy every 10 years.  Flexible sigmoidoscopy or colonoscopy.** / Every 5 years for a flexible sigmoidoscopy or every 10 years for a colonoscopy beginning at age 36 years and continuing until age 33 years.  Hepatitis C blood test.** / For all people born from [birth year] through 1963 and any individual with known risks for hepatitis C.  Skin self-exam. / Monthly.  Influenza vaccine. / Every year.  Tetanus, diphtheria, and acellular pertussis (Tdap/Td) vaccine.** / Consult your health care provider. Pregnant women should receive 1 dose of Tdap vaccine during each pregnancy. 1 dose of Td every 10 years.  Varicella vaccine.** / Consult your health care provider. Pregnant females who do not have evidence of immunity should receive the first dose after pregnancy.  Zoster vaccine.** / 1 dose for adults aged 21 years or older.  Measles, mumps, rubella (MMR) vaccine.** / You need at least 1 dose of MMR if you were born in 1957 or later. You may also need a 2nd dose. For females of childbearing age, rubella immunity should be determined. If there is no evidence of immunity, females who are not pregnant should be vaccinated. If there is no evidence of immunity, females who are pregnant should delay immunization until after pregnancy.  Pneumococcal 13-valent conjugate (PCV13) vaccine.** /  Consult your health care provider.  Pneumococcal polysaccharide (PPSV23) vaccine.** / 1 to 2 doses if you smoke cigarettes or if you have certain conditions.  Meningococcal vaccine.** / Consult your health care provider.  Hepatitis A vaccine.** / Consult your health care provider.  Hepatitis B vaccine.** / Consult your health care provider.  Haemophilus influenzae type b (Hib) vaccine.** / Consult your health care provider. Ages 5 years and over  Blood pressure check.** / Every 1 to 2 years.  Lipid and cholesterol  check.** / Every 5 years beginning at age 30 years.  Lung cancer screening. / Every year if you are aged 81-80 years and have a 30-pack-year history of smoking and currently smoke or have quit within the past 15 years. Yearly screening is stopped once you have quit smoking for at least 15 years or develop a health problem that would prevent you from having lung cancer treatment.  Clinical breast exam.** / Every year after age 24 years.  BRCA-related cancer risk assessment.** / For women who have family members with a BRCA-related cancer (breast, ovarian, tubal, or peritoneal cancers).  Mammogram.** / Every year beginning at age 60 years and continuing for as long as you are in good health. Consult with your health care provider.  Pap test.** / Every 3 years starting at age 27 years through age 65 or 12 years with 3 consecutive normal Pap tests. Testing can be stopped between 65 and 70 years with 3 consecutive normal Pap tests and no abnormal Pap or HPV tests in the past 10 years.  HPV screening.** / Every 3 years from ages 46 years through ages 42 or 81 years with a history of 3 consecutive normal Pap tests. Testing can be stopped between 65 and 70 years with 3 consecutive normal Pap tests and no abnormal Pap or HPV tests in the past 10 years.  Fecal occult blood test (FOBT) of stool. / Every year beginning at age 55 years and continuing until age 84 years. You may not need to do this test if you get a colonoscopy every 10 years.  Flexible sigmoidoscopy or colonoscopy.** / Every 5 years for a flexible sigmoidoscopy or every 10 years for a colonoscopy beginning at age 89 years and continuing until age 39 years.  Hepatitis C blood test.** / For all people born from [birth year] through 1963 and any individual with known risks for hepatitis C.  Osteoporosis screening.** / A one-time screening for women ages 58 years and over and women at risk for fractures or osteoporosis.  Skin self-exam. /  Monthly.  Influenza vaccine. / Every year.  Tetanus, diphtheria, and acellular pertussis (Tdap/Td) vaccine.** / 1 dose of Td every 10 years.  Varicella vaccine.** / Consult your health care provider.  Zoster vaccine.** / 1 dose for adults aged 17 years or older.  Pneumococcal 13-valent conjugate (PCV13) vaccine.** / Consult your health care provider.  Pneumococcal polysaccharide (PPSV23) vaccine.** / 1 dose for all adults aged 53 years and older.  Meningococcal vaccine.** / Consult your health care provider.  Hepatitis A vaccine.** / Consult your health care provider.  Hepatitis B vaccine.** / Consult your health care provider.  Haemophilus influenzae type b (Hib) vaccine.** / Consult your health care provider. ** Family history and personal history of risk and conditions may change your health care provider's recommendations. Document Released: 11/09/2001 Document Revised: 01/28/2014 Document Reviewed: 02/08/2011 [facility] Patient Information 2015 [place], [place]. This information is not intended to replace advice given to you by your health  care provider. Make sure you discuss any questions you have with your health care provider.

## 2014-12-10 LAB — URINE CULTURE
COLONY COUNT: NO GROWTH
Organism ID, Bacteria: NO GROWTH

## 2014-12-10 LAB — CYTOLOGY - PAP

## 2014-12-10 LAB — HIV ANTIBODY (ROUTINE TESTING W REFLEX): HIV 1&2 Ab, 4th Generation: NONREACTIVE

## 2014-12-17 ENCOUNTER — Telehealth: Payer: Self-pay | Admitting: Medical

## 2014-12-17 NOTE — Telephone Encounter (Signed)
Note/question  to referral staff/Jennifer on pt regarding  Chest ct  denial.

## 2014-12-17 NOTE — Telephone Encounter (Signed)
Screening ct of chest due to smoking hx declined. Will get lpn to inform pt. If she is having any cough, wheezing, respiratory symptoms then want her to come in and will get cxr.

## 2014-12-18 NOTE — Telephone Encounter (Signed)
Patient coming in Monday  The 28th at 1:00 pm

## 2014-12-18 NOTE — Telephone Encounter (Signed)
I am not sure why, it just states that it did not meet guide lines

## 2014-12-18 NOTE — Telephone Encounter (Signed)
Patient aware of appt

## 2014-12-23 ENCOUNTER — Encounter: Payer: Self-pay | Admitting: Medical

## 2014-12-23 ENCOUNTER — Ambulatory Visit (INDEPENDENT_AMBULATORY_CARE_PROVIDER_SITE_OTHER): Payer: 59 | Admitting: Medical

## 2014-12-23 ENCOUNTER — Ambulatory Visit (HOSPITAL_BASED_OUTPATIENT_CLINIC_OR_DEPARTMENT_OTHER)
Admission: RE | Admit: 2014-12-23 | Discharge: 2014-12-23 | Disposition: A | Discharge status: Home / Self Care | Payer: 59 | Source: Ambulatory Visit | Attending: Podiatry | Admitting: Podiatry

## 2014-12-23 VITALS — BP 118/71 | HR 93 | Temp 98.1°F | Wt 125.0 lb

## 2014-12-23 DIAGNOSIS — M19072 Primary osteoarthritis, left ankle and foot: Secondary | ICD-10-CM | POA: Insufficient documentation

## 2014-12-23 DIAGNOSIS — M19071 Primary osteoarthritis, right ankle and foot: Secondary | ICD-10-CM | POA: Diagnosis not present

## 2014-12-23 DIAGNOSIS — R0789 Other chest pain: Secondary | ICD-10-CM | POA: Diagnosis not present

## 2014-12-23 DIAGNOSIS — M79675 Pain in left toe(s): Secondary | ICD-10-CM | POA: Diagnosis present

## 2014-12-23 LAB — TROPONIN I: TNIDX: 0.01 ug/l (ref 0.00–0.06)

## 2014-12-23 IMAGING — DX DG FOOT COMPLETE 3+V*R*
3 series · 3 of 3 positions shown · non-contrast
Comparison: None.

CLINICAL DATA: Pain along first metatarsal and first toe both feet
for 1 year. No known injury.

EXAM:
RIGHT FOOT COMPLETE - 3+ VIEW

[foot ap]
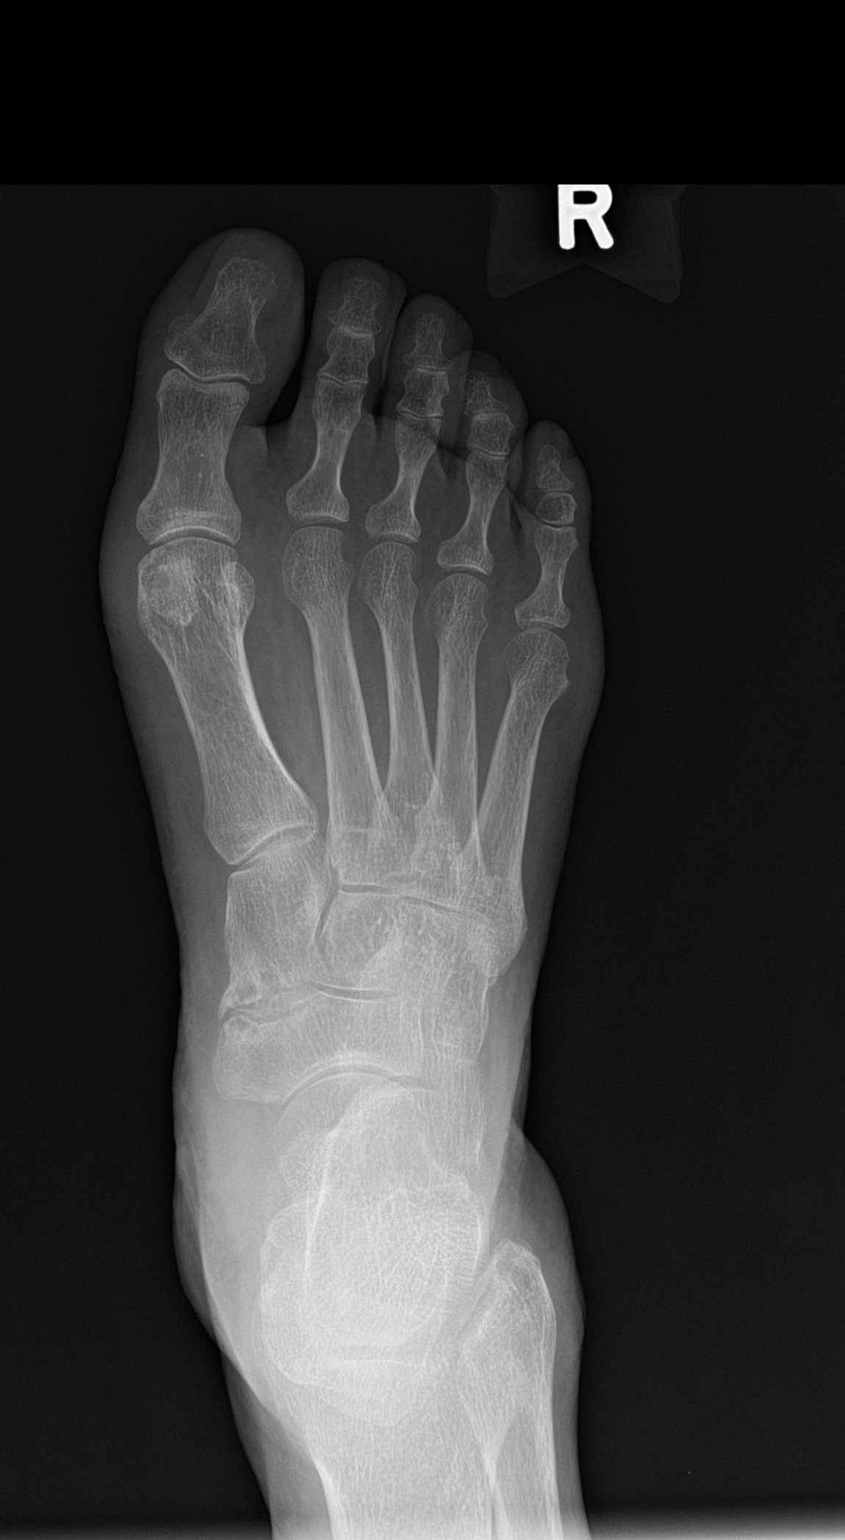

[foot obl]
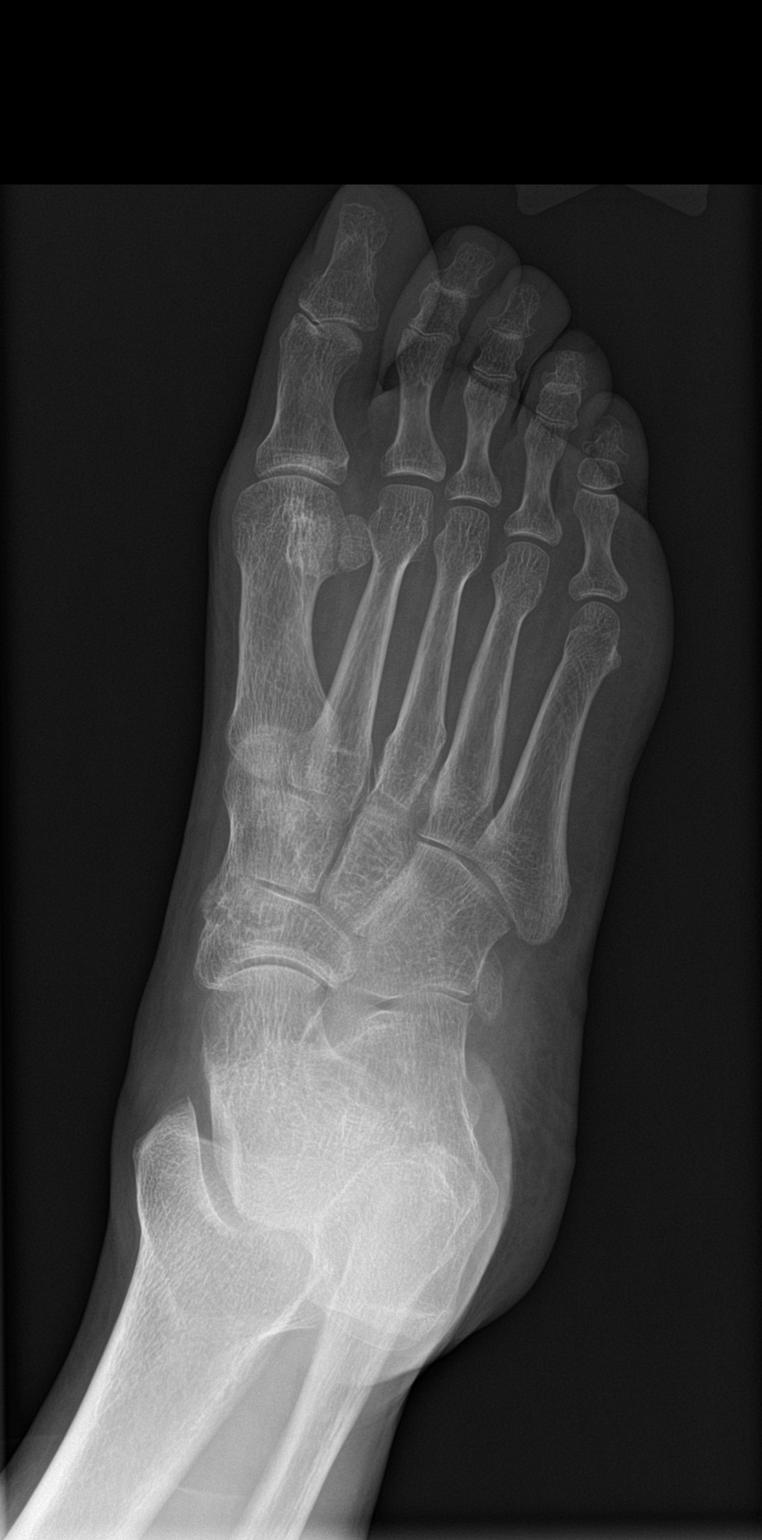

[foot lat]
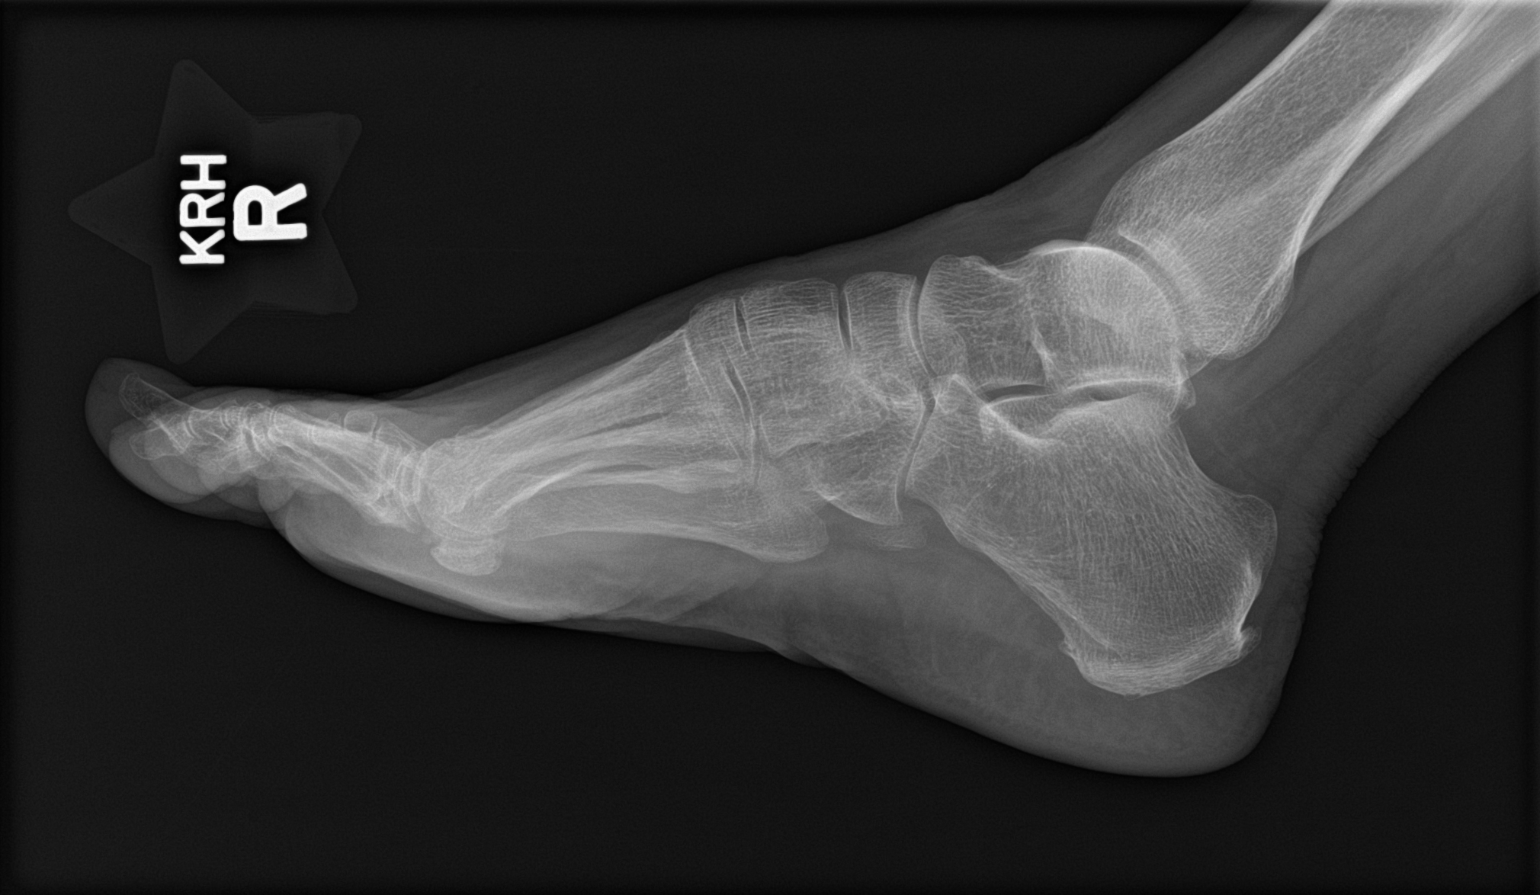

[3 of 3 positions shown; findings below may reference images not displayed]

FINDINGS: Slight joint space narrowing within the first MTP joint. No
significant spurring. No erosions. No acute bony abnormality.
Specifically, no fracture, subluxation, or dislocation. Soft tissues
are intact.
IMPRESSION: Early degenerative changes in the first MTP joint. No acute
findings.

## 2014-12-23 NOTE — Progress Notes (Signed)
   Subjective:    Patient ID: Barbara Buchanan, female    DOB: 05/29/1951, 64 y.o.   MRN: [id number]  HPI   Pt in for follow up. Her CT of the chest for screening purposes were denied on wellness exam. Pt is a very long hx of smoking before she stopped. 2 pack of cigarettes  For years. She gets occaional short of breath but not very often.  Pt also states occasional pain in her mid chest toward to her back. Example last night she had pain that last 3-4 hours but then resolved. No current symptoms presently. This is occuring 1 time a day. She takes advil and pain resolved. Time of chest pain varies.      Review of Systems  Constitutional: Negative for fever, chills and fatigue.  Respiratory: Negative for cough, chest tightness and wheezing.   Cardiovascular: Positive for chest pain. Negative for palpitations.       None presently.  Gastrointestinal: Negative for abdominal pain.  Musculoskeletal: Negative for back pain.       Some mid back pain last night.  Neurological: Negative for facial asymmetry and headaches.  Hematological: Negative for adenopathy. Does not bruise/bleed easily.       Objective:   Physical Exam General Mental Status- Alert. General Appearance- Not in acute distress.   Skin General: Color- Normal Color. Moisture- Normal Moisture.  Neck Carotid Arteries- Normal color. Moisture- Normal Moisture. No carotid bruits. No JVD.  Chest and Lung Exam Auscultation: Breath Sounds:-Normal.  Cardiovascular Auscultation:Rythm- Regular. Murmurs & Other Heart Sounds:Auscultation of the heart reveals- No Murmurs.  Abdomen Inspection:-Inspeection Normal. Palpation/Percussion:Note:No mass. Palpation and Percussion of the abdomen reveal- Non Tender, Non Distended + BS, no rebound or guarding.    Neurologic Cranial Nerve exam:- CN III-XII intact(No nystagmus), symmetric smile. Drift Test:- No drift. Romberg Exam:- Negative.  Heal to Toe Gait exam:-Normal. Finger to Nose:-  Normal/Intact Strength:- 5/5 equal and symmetric strength both upper and lower extremities.       Assessment & Plan:

## 2014-12-23 NOTE — Patient Instructions (Addendum)
Atypical chest pain Ekg today. Will get cxr as well. Troponin stat as well.   I will go ahead and refer you to cardiology for stress based on fact pain is daily.  Future order to get fasting lipid panel this week.      Follow up in 7 days or as needed.  If you have any chest pain after hours prior to cardiologist then ED evaluation.  Regarding ct of chest denial. Will follow cxr and see if any abnormal finding seen.

## 2014-12-23 NOTE — Progress Notes (Signed)
Pre visit review using our clinic review tool, if applicable. No additional management support is needed unless otherwise documented below in the visit note. 

## 2014-12-23 NOTE — Assessment & Plan Note (Addendum)
Ekg today. Will get cxr as well. Troponin stat as well.   I will go ahead and refer you to cardiology for stress based on fact pain is daily.  Future order to get fasting lipid panel this week.

## 2014-12-25 ENCOUNTER — Ambulatory Visit (INDEPENDENT_AMBULATORY_CARE_PROVIDER_SITE_OTHER): Payer: 59 | Admitting: Cardiology

## 2014-12-25 ENCOUNTER — Encounter: Payer: Self-pay | Admitting: Cardiology

## 2014-12-25 VITALS — BP 110/68 | HR 79 | Ht 59.0 in | Wt 128.4 lb

## 2014-12-25 DIAGNOSIS — R079 Chest pain, unspecified: Secondary | ICD-10-CM

## 2014-12-25 DIAGNOSIS — I10 Essential (primary) hypertension: Secondary | ICD-10-CM

## 2014-12-25 DIAGNOSIS — R9431 Abnormal electrocardiogram [ECG] [EKG]: Secondary | ICD-10-CM

## 2014-12-25 LAB — TROPONIN I: TNIDX: 0 ug/l (ref 0.00–0.06)

## 2014-12-25 LAB — CARDIAC PANEL
CK-MB: 0.6 ng/mL (ref 0.3–4.0)
Relative Index: 1.7 calc (ref 0.0–2.5)
Total CK: 35 U/L (ref 7–177)

## 2014-12-25 MED ORDER — PANTOPRAZOLE SODIUM 40 MG PO TBEC: 40.0000 mg | DELAYED_RELEASE_TABLET | Freq: Every day | ORAL | Status: DC

## 2014-12-25 NOTE — Progress Notes (Signed)
Cardiology Office Note   Date:  12/25/2014   ID:  Barbara Buchanan Feb 14, 1951, MRN [id number]  PCP:  Esperanza Richters, PA-C    No chief complaint on file.     History of Present Illness: Barbara Buchanan is a 64 y.o. female who presents for evaluation of chest pain.  The pain started about 1 month ago and she describes it as a pressure.  She recently saw her PCP and mentioned that she was having intermittent CP in the mid sternal area that radiates to her back and she was referred to Cardiology.  It can last up to 3-4 hours.  It is nonexertional.  It can be associated with SOB and diaphoretic but no nausea.  It usually occurs 2-3 times daily and nighttime is the worst.  The pain resolves with advil.  She has not tried any antacids.  She has a history of tobacco use 2ppd for 60 years.  She occasionally has SOB.  EKG showed NSR with rSR' in V1.      Past Medical History  Diagnosis Date  . Cataract   . Depression   . Hypertension   . Osteoporosis     History reviewed. No pertinent past surgical history.   Current Outpatient Prescriptions  Medication Sig Dispense Refill  . lisinopril (PRINIVIL,ZESTRIL) 10 MG tablet Take 1 tablet (10 mg total) by mouth daily. 30 tablet 5   No current facility-administered medications for this visit.    Allergies:   Penicillins    Social History:  The patient  reports that she quit smoking about 5 years ago. She has never used smokeless tobacco. She reports that she drinks alcohol. She reports that she does not use illicit drugs.   Family History:  The patient's family history includes Stroke in her mother.    ROS:  Please see the history of present illness.   Otherwise, review of systems are positive for none.   All other systems are reviewed and negative.    PHYSICAL EXAM: VS:  BP 110/68 mmHg  Pulse 79  Ht  (1.499 m)  Wt 128 lb 6.4 oz (58.242 kg)  BMI 25.92 kg/m2  SpO2 94% , BMI Body mass index is 25.92 kg/(m^2). GEN: Well nourished, well  developed, in no acute distress HEENT: normal Neck: no JVD, carotid bruits, or masses Cardiac: RRR; no murmurs, rubs, or gallops,no edema  Respiratory:  clear to auscultation bilaterally, normal work of breathing GI: soft, nontender, nondistended, + BS MS: no deformity or atrophy Skin: warm and dry, no rash Neuro:  Strength and sensation are intact Psych: euthymic mood, full affect   EKG:  EKG was ordered today showing NSR with with nonspecific T wave abnormality in V1 and V2 with rSR' in V1    Recent Labs: 10/21/2014: ALT 55*; BUN 9; Creatinine 0.54; Hemoglobin 14.7; Platelets 363.0; Potassium 3.9; Sodium 139    Lipid Panel No results found for: CHOL, TRIG, HDL, CHOLHDL, VLDL, LDLCALC, LDLDIRECT    Wt Readings from Last 3 Encounters:  12/25/14 128 lb 6.4 oz (58.242 kg)  12/23/14 125 lb (56.7 kg)  12/09/14 124 lb 9.6 oz (56.518 kg)       ASSESSMENT AND PLAN:  1.  Chest pain with typical and atypical in that it is nonexertional but is associated with diaphoresis and SOB.  EKG shows non specific T wave abnormality in V1 and V2 in association with rSR' in V1.Marland Kitchen  Her CRF include post menopausal state and history of tobacco  use.  I will get a Stress myoview to rule out ischemia dn 2D echo to assess LVF.  She had some pain intiially when she got here this am but EKG without any acute ST changes.  I am suspicious that this may be GI in etiology in that it is much worse at night.  I will check a stat tropoinin.  Start Protonix 40mg  daily.   2.  HTN - well controlled 3.  Tobacco use she quit 5 years ago 4.  Abnormal EKG with T wave inversions in V1 and V2 in presence of rSR" in V1 consistent with RV conduction delay   Current medicines are reviewed at length with the patient today.  The patient does not have concerns regarding medicines.  The following changes have been made:  no change  Labs/ tests ordered today include: see above assessment and plan No orders of the defined types  were placed in this encounter.     Disposition:   FU with me PRN pending results of studies  SignedQuintella Reichert, Elim Peale R, MD  12/25/2014 8:30 AM    [facility] [address], [place], [place]  [address] Phone: [phone]; Fax: [phone]

## 2014-12-25 NOTE — Patient Instructions (Addendum)
Your physician has recommended you make the following change in your medication:  1) START PROTONIX 40 mg daily  Your physician has requested that you have an echocardiogram. Echocardiography is a painless test that uses sound waves to create images of your heart. It provides your doctor with information about the size and shape of your heart and how well your heart's chambers and valves are working. This procedure takes approximately one hour. There are no restrictions for this procedure.  Dr. Mayford Knifeurner recommends you have a NUCLEAR STRESS TEST.  Your physician recommends that you have lab work TODAY (troponin, CK).  Your physician recommends that you schedule a follow-up appointment in: 1 month with Dr. Mayford Knifeurner.

## 2014-12-26 ENCOUNTER — Other Ambulatory Visit (INDEPENDENT_AMBULATORY_CARE_PROVIDER_SITE_OTHER): Payer: 59

## 2014-12-26 ENCOUNTER — Other Ambulatory Visit: Payer: Self-pay

## 2014-12-26 ENCOUNTER — Telehealth: Payer: Self-pay | Admitting: Medical

## 2014-12-26 DIAGNOSIS — R0789 Other chest pain: Secondary | ICD-10-CM | POA: Diagnosis not present

## 2014-12-26 LAB — LIPID PANEL
Cholesterol: 232 mg/dL — ABNORMAL HIGH (ref 0–200)
HDL: 51 mg/dL (ref >39.00)
LDL CALC: 161 mg/dL — AB (ref 0–99)
NonHDL: 181
TRIGLYCERIDES: 98 mg/dL (ref 0.0–149.0)
Total CHOL/HDL Ratio: 5
VLDL: 19.6 mg/dL (ref 0.0–40.0)

## 2014-12-26 MED ORDER — ROSUVASTATIN CALCIUM 20 MG PO TABS: 20.0000 mg | ORAL_TABLET | Freq: Every day | ORAL | Status: DC

## 2014-12-26 NOTE — Addendum Note (Signed)
Addended by: Verdie ShireBAYNES, Sumaiyah Markert M on: 12/26/2014 08:02 AM   Modules accepted: Orders

## 2014-12-26 NOTE — Telephone Encounter (Signed)
rx crestor.

## 2014-12-30 ENCOUNTER — Ambulatory Visit (INDEPENDENT_AMBULATORY_CARE_PROVIDER_SITE_OTHER): Payer: 59 | Admitting: Medical

## 2014-12-30 ENCOUNTER — Encounter: Payer: Self-pay | Admitting: Medical

## 2014-12-30 ENCOUNTER — Ambulatory Visit (HOSPITAL_BASED_OUTPATIENT_CLINIC_OR_DEPARTMENT_OTHER)
Admission: RE | Admit: 2014-12-30 | Discharge: 2014-12-30 | Disposition: A | Discharge status: Home / Self Care | Payer: 59 | Source: Ambulatory Visit | Attending: Medical | Admitting: Medical

## 2014-12-30 VITALS — BP 137/55 | HR 79 | Temp 98.0°F | Ht 59.0 in | Wt 127.2 lb

## 2014-12-30 DIAGNOSIS — Z1231 Encounter for screening mammogram for malignant neoplasm of breast: Secondary | ICD-10-CM | POA: Diagnosis not present

## 2014-12-30 DIAGNOSIS — M25572 Pain in left ankle and joints of left foot: Secondary | ICD-10-CM | POA: Diagnosis not present

## 2014-12-30 DIAGNOSIS — Z1239 Encounter for other screening for malignant neoplasm of breast: Secondary | ICD-10-CM

## 2014-12-30 DIAGNOSIS — M2550 Pain in unspecified joint: Secondary | ICD-10-CM | POA: Diagnosis not present

## 2014-12-30 DIAGNOSIS — R0789 Other chest pain: Secondary | ICD-10-CM

## 2014-12-30 DIAGNOSIS — M25579 Pain in unspecified ankle and joints of unspecified foot: Secondary | ICD-10-CM | POA: Insufficient documentation

## 2014-12-30 LAB — C-REACTIVE PROTEIN: CRP: 0.7 mg/dL (ref 0.5–20.0)

## 2014-12-30 LAB — SEDIMENTATION RATE: Sed Rate: 38 mm/hr — ABNORMAL HIGH (ref 0–22)

## 2014-12-30 LAB — RHEUMATOID FACTOR

## 2014-12-30 LAB — URIC ACID: Uric Acid, Serum: 7.9 mg/dL — ABNORMAL HIGH (ref 2.4–7.0)

## 2014-12-30 MED ORDER — DICLOFENAC SODIUM 75 MG PO TBEC: 75.0000 mg | DELAYED_RELEASE_TABLET | Freq: Two times a day (BID) | ORAL | Status: DC

## 2014-12-30 MED ORDER — ALLOPURINOL 100 MG PO TABS: 100.0000 mg | ORAL_TABLET | Freq: Every day | ORAL | Status: DC

## 2014-12-30 MED ORDER — TRAMADOL HCL 50 MG PO TABS: 50.0000 mg | ORAL_TABLET | Freq: Three times a day (TID) | ORAL | Status: DC | PRN

## 2014-12-30 MED ORDER — KETOROLAC TROMETHAMINE 60 MG/2ML IM SOLN
60.0000 mg | Freq: Once | INTRAMUSCULAR | Status: AC
  Administered 2014-12-30: 60 mg via INTRAMUSCULAR

## 2014-12-30 NOTE — Assessment & Plan Note (Signed)
None currently but still advised follow up with cardiologist for stress test.

## 2014-12-30 NOTE — Progress Notes (Signed)
   Subjective:    Patient ID: Barbara Buchanan, female    DOB: [birth year], 64 y.o.   MRN: [id number]  HPI   Pt in for follow up. Her cardiologist work up partially done. Pt has follow up appointment for further testing in 2 wks. Cardiologist thinks maybe her symptoms GI related and rx of protonix given. She gave this pending stress test. Pt on review she states does not have gerd type symptoms.    Pt reports left foot and ankle pain. Lateral aspect. Pt states started 4-5 days ago. Then pain got worse yesterday after church. No injury or fall. Pt some pain occasional on top aspect rt and left on and off in the past.     Review of Systems  Constitutional: Negative for fever, chills and fatigue.  Respiratory: Negative for chest tightness, shortness of breath and wheezing.   Cardiovascular: Negative for chest pain and palpitations.  Gastrointestinal: Negative for abdominal pain, diarrhea and constipation.  Musculoskeletal:       Lt ankle pain and foot pain.  Skin:       No redness, warmth of foot.   Past Medical History  Diagnosis Date  . Cataract   . Depression   . Hypertension   . Osteoporosis     History   Social History  . Marital Status: Single    Spouse Name: N/A  . Number of Children: N/A  . Years of Education: N/A   Occupational History  . Not on file.   Social History Main Topics  . Smoking status: Former Smoker    Quit date: 10/21/2009  . Smokeless tobacco: Never Used  . Alcohol Use: Yes  . Drug Use: No  . Sexual Activity: Not on file   Other Topics Concern  . Not on file   Social History Narrative    No past surgical history on file.  Family History  Problem Relation Age of Onset  . Stroke Mother     Allergies  Allergen Reactions  . Penicillins     Current Outpatient Prescriptions on File Prior to Visit  Medication Sig Dispense Refill  . lisinopril (PRINIVIL,ZESTRIL) 10 MG tablet Take 1 tablet (10 mg total) by mouth daily. 30 tablet 5  . pantoprazole  (PROTONIX) 40 MG tablet Take 1 tablet (40 mg total) by mouth daily. 30 tablet 6  . rosuvastatin (CRESTOR) 20 MG tablet Take 1 tablet (20 mg total) by mouth daily. 30 tablet 3   No current facility-administered medications on file prior to visit.    BP 137/55 mmHg  Pulse 79  Temp(Src) 98 F (36.7 C) (Oral)  Ht 4\' 11"  (1.499 m)  Wt 127 lb 3.2 oz (57.698 kg)  BMI 25.68 kg/m2  SpO2 100%      Objective:   Physical Exam  General- No acute distress. Pleasant patient. Neck- Full range of motion, no jvd Lungs- Clear, even and unlabored. Heart- regular rate and rhythm. Neurologic- CNII- XII grossly intact. Lt ankle- faint lateral aspect tender to palpation presently. Lt foot- lateral aspect over 5th metarsal area pain Rt foot- no pain on palpation today. Skin- on both feet no redness, no warmth.      Assessment & Plan:

## 2014-12-30 NOTE — Patient Instructions (Addendum)
Pain in joint, ankle and foot Patient has some degenerative changes. Will get arthritis panel and uric acid today. Toradol im today.  Rx of diclofenac. And limited rx of tramadol.  Follow up in 2 weeks or as needed.   Atypical chest pain None currently but still advised follow up with cardiologist for stress test.    I offered pt work excuse due to her pain but she declined. But if she calls back will give days.

## 2014-12-30 NOTE — Progress Notes (Signed)
Pre visit review using our clinic review tool, if applicable. No additional management support is needed unless otherwise documented below in the visit note. 

## 2014-12-30 NOTE — Assessment & Plan Note (Signed)
Patient has some degenerative changes. Will get arthritis panel and uric acid today. Toradol im today.  Rx of diclofenac. And limited rx of tramadol.  Follow up in 2 weeks or as needed.

## 2014-12-31 ENCOUNTER — Ambulatory Visit: Payer: Self-pay | Admitting: Podiatry

## 2014-12-31 LAB — ANA: Anti Nuclear Antibody(ANA): NEGATIVE

## 2014-12-31 NOTE — Telephone Encounter (Signed)
Notified patient Allopurinol call in.

## 2015-01-13 ENCOUNTER — Ambulatory Visit (HOSPITAL_BASED_OUTPATIENT_CLINIC_OR_DEPARTMENT_OTHER): Payer: 59 | Admitting: Radiology

## 2015-01-13 ENCOUNTER — Ambulatory Visit (HOSPITAL_COMMUNITY): Payer: 59 | Attending: Cardiovascular Disease

## 2015-01-13 DIAGNOSIS — R079 Chest pain, unspecified: Secondary | ICD-10-CM

## 2015-01-13 MED ORDER — REGADENOSON 0.4 MG/5ML IV SOLN
0.4000 mg | Freq: Once | INTRAVENOUS | Status: AC
  Administered 2015-01-13: 0.4 mg via INTRAVENOUS

## 2015-01-13 MED ORDER — TECHNETIUM TC 99M SESTAMIBI GENERIC - CARDIOLITE
30.0000 | Freq: Once | INTRAVENOUS | Status: AC | PRN
  Administered 2015-01-13: 30 via INTRAVENOUS

## 2015-01-13 MED ORDER — TECHNETIUM TC 99M SESTAMIBI GENERIC - CARDIOLITE
10.0000 | Freq: Once | INTRAVENOUS | Status: AC | PRN
  Administered 2015-01-13: 10 via INTRAVENOUS

## 2015-01-13 NOTE — Progress Notes (Signed)
2D Echo completed. 01/13/2015 

## 2015-01-13 NOTE — Progress Notes (Signed)
[facility] SITE 3 NUCLEAR MED [address] [place], [place] [address] [phone]    Cardiology Nuclear Med Study  Barbara DoingOk Buchanan is a 64 y.o. female     MRN : [id number]     DOB: 1950/10/22  Procedure Date: 01/13/2015  Nuclear Med Background Indication for Stress Test:  Evaluation for Ischemia and Abnormal EKG History:  CAD Cardiac Risk Factors: Hypertension and Lipids  Symptoms:  Chest Pain and Diaphoresis   Nuclear Pre-Procedure Caffeine/Decaff Intake:  None NPO After: 7:00pm   Lungs:  clear O2 Sat: 97% on room air. IV 0.9% NS with Angio Cath:  22g  IV Site: R Hand  IV Started by:  Cathlyn Parsonsynthia Amee Boothe, RN  Chest Size (in):  36 Cup Size: B  Height: 4\' 11"  (1.499 m)  Weight:  125 lb (56.7 kg)  BMI:  Body mass index is 25.23 kg/(m^2). Tech Comments:  n/a    Nuclear Med Study 1 or 2 day study: 1 day  Stress Test Type:  Lexiscan  Reading MD: n/a  Order Authorizing Provider:  Gloris Manchesterraci Turner,MD  Resting Radionuclide: Technetium 128m Sestamibi  Resting Radionuclide Dose: 11.0 mCi   Stress Radionuclide:  Technetium 8428m Sestamibi  Stress Radionuclide Dose: 33.0 mCi           Stress Protocol Rest HR: 78 Stress HR: 114  Rest BP: 144/74 Stress BP: 114/83  Exercise Time (min): n/a METS: n/a   Predicted Max HR: 159 bpm % Max HR: 71.7 bpm Rate Pressure Product: [id number]   Dose of Adenosine (mg):  n/a Dose of Lexiscan: 0.4 mg  Dose of Atropine (mg): n/a Dose of Dobutamine: n/a mcg/kg/min (at max HR)  Stress Test Technologist: Nelson ChimesSharon Brooks, BS-ES  Nuclear Technologist:  Kerby NoraElzbieta Kubak, CNMT     Rest Procedure:  Myocardial perfusion imaging was performed at rest 45 minutes following the intravenous administration of Technetium 3628m Sestamibi. Rest ECG: Normal sinus rhythm.  Stress Procedure:  The patient received IV Lexiscan 0.4 mg over 15-seconds.  Technetium 3228m Sestamibi injected at 30-seconds.  Quantitative spect images were obtained after a 45 minute delay.  During the  infusion of Lexiscan the patient complained of SOB, fatigue, headache and nausea.  These symptoms began to resolve in recovery.  Stress ECG: No significant change from baseline ECG  QPS Raw Data Images:  Normal; no motion artifact; normal heart/lung ratio. Stress Images:  The ventricle is small. The tomographic images created near  the apex do not engage enough myocardium. This gives the impression on the quantitative analysis that there is decreased activity at the apex. Review of the tomographic images that can be used appropriately shows that there is no significant abnormality. Rest Images:  The rest images show appropriate decreased activity compared to the stress images. Subtraction (SDS):  No evidence of ischemia. Transient Ischemic Dilatation (Normal <1.22):  0.95 Lung/Heart Ratio (Normal <0.45):  0.29  Quantitative Gated Spect Images QGS EDV:  43 ml QGS ESV:  12 ml  Impression Exercise Capacity:  Lexiscan with no exercise. BP Response:  Normal blood pressure response. Clinical Symptoms:  Shortness of breath ECG Impression:  No significant ST segment change suggestive of ischemia. Comparison with Prior Nuclear Study: No previous nuclear study performed  Overall Impression:  The study is difficult to interpret because of the small size of the ventricle and the way in which the tomographic images are obtained. After careful review, I feel that there is no significant abnormality. There is no scar or ischemia. There  is normal wall motion. This is a low risk scan.  LV Ejection Fraction: 72%.  LV Wall Motion:  Normal Wall Motion   Jerral Bonito, MD

## 2015-01-14 ENCOUNTER — Telehealth: Payer: Self-pay | Admitting: Cardiology

## 2015-01-14 NOTE — Telephone Encounter (Signed)
Left message to call back  

## 2015-01-14 NOTE — Telephone Encounter (Signed)
Please let patient know that patient had normal echo

## 2015-01-14 NOTE — Telephone Encounter (Signed)
No VM set up, unable to leave message.  Will try again later.

## 2015-01-15 NOTE — Telephone Encounter (Signed)
Informed patient of results and verbal understanding expressed.  

## 2015-01-17 ENCOUNTER — Encounter: Payer: Self-pay | Admitting: Medical

## 2015-01-17 ENCOUNTER — Telehealth: Payer: Self-pay | Admitting: Medical

## 2015-01-17 NOTE — Progress Notes (Unsigned)
Friend/Family called in regarding patient called in stating patient has leg numbness and problems ambulating. Advised Myriam Jacobson( Helen) patient needs to be taken to ED for evaluation. States she would like to speak with provider ES. Advised I would have him call her back that he was on the phone at the time. ES given message.

## 2015-01-17 NOTE — Telephone Encounter (Signed)
Pt friend state both legs numb and she feels some dizziness. Advised to go to ED now. Friends expressed understanding. LPN advised the same.

## 2015-01-18 ENCOUNTER — Telehealth: Payer: Self-pay | Admitting: Medical

## 2015-01-18 NOTE — Telephone Encounter (Signed)
Note appears on chart review pt did not go to ED?? I talked to pt friend yesterday who usually comes with pt to translate for me.

## 2015-02-10 ENCOUNTER — Ambulatory Visit: Payer: 59 | Admitting: Cardiology

## 2015-02-13 ENCOUNTER — Telehealth: Payer: Self-pay | Admitting: Medical

## 2015-02-13 MED ORDER — ALLOPURINOL 100 MG PO TABS: 100.0000 mg | ORAL_TABLET | Freq: Every day | ORAL | Status: DC

## 2015-02-13 NOTE — Telephone Encounter (Signed)
Caller name:Quisha Relationship to patient:self  Can be reached:[phone] Pharmacy: [facility] [address] [place]   Reason for call: allopurinol 100 mg take 1 per day  Please send refill

## 2015-02-13 NOTE — Telephone Encounter (Signed)
Allopurinol rx sent in 

## 2015-03-03 ENCOUNTER — Other Ambulatory Visit: Payer: Self-pay

## 2015-03-03 ENCOUNTER — Encounter: Payer: Self-pay | Admitting: Medical

## 2015-03-03 ENCOUNTER — Ambulatory Visit (INDEPENDENT_AMBULATORY_CARE_PROVIDER_SITE_OTHER): Payer: 59 | Admitting: Medical

## 2015-03-03 ENCOUNTER — Ambulatory Visit (HOSPITAL_BASED_OUTPATIENT_CLINIC_OR_DEPARTMENT_OTHER)
Admission: RE | Admit: 2015-03-03 | Discharge: 2015-03-03 | Disposition: A | Discharge status: Home / Self Care | Payer: 59 | Source: Ambulatory Visit | Attending: Medical | Admitting: Medical

## 2015-03-03 VITALS — BP 140/93 | HR 83 | Temp 97.7°F | Ht 59.0 in | Wt 130.2 lb

## 2015-03-03 DIAGNOSIS — M7989 Other specified soft tissue disorders: Secondary | ICD-10-CM | POA: Diagnosis not present

## 2015-03-03 DIAGNOSIS — M25572 Pain in left ankle and joints of left foot: Secondary | ICD-10-CM | POA: Diagnosis not present

## 2015-03-03 DIAGNOSIS — R739 Hyperglycemia, unspecified: Secondary | ICD-10-CM | POA: Diagnosis not present

## 2015-03-03 LAB — CBC WITH DIFFERENTIAL/PLATELET
Basophils Absolute: 0 10*3/uL (ref 0.0–0.1)
Basophils Relative: 0.5 % (ref 0.0–3.0)
EOS ABS: 0.1 10*3/uL (ref 0.0–0.7)
EOS PCT: 1.7 % (ref 0.0–5.0)
HCT: 40 % (ref 36.0–46.0)
HEMOGLOBIN: 13.2 g/dL (ref 12.0–15.0)
Lymphocytes Relative: 35.7 % (ref 12.0–46.0)
Lymphs Abs: 2.5 10*3/uL (ref 0.7–4.0)
MCHC: 33.1 g/dL (ref 30.0–36.0)
MCV: 92.3 fl (ref 78.0–100.0)
MONO ABS: 0.4 10*3/uL (ref 0.1–1.0)
Monocytes Relative: 5.7 % (ref 3.0–12.0)
Neutro Abs: 3.9 10*3/uL (ref 1.4–7.7)
Neutrophils Relative %: 56.4 % (ref 43.0–77.0)
Platelets: 363 10*3/uL (ref 150.0–400.0)
RBC: 4.34 Mil/uL (ref 3.87–5.11)
RDW: 13.2 % (ref 11.5–15.5)
WBC: 7 10*3/uL (ref 4.0–10.5)

## 2015-03-03 LAB — URIC ACID: Uric Acid, Serum: 5.6 mg/dL (ref 2.4–7.0)

## 2015-03-03 LAB — HEMOGLOBIN A1C: Hgb A1c MFr Bld: 7 % — ABNORMAL HIGH (ref 4.6–6.5)

## 2015-03-03 MED ORDER — DICLOFENAC SODIUM 75 MG PO TBEC: 75.0000 mg | DELAYED_RELEASE_TABLET | Freq: Two times a day (BID) | ORAL | Status: DC

## 2015-03-03 MED ORDER — KETOROLAC TROMETHAMINE 60 MG/2ML IM SOLN
60.0000 mg | Freq: Once | INTRAMUSCULAR | Status: AC
  Administered 2015-03-03: 60 mg via INTRAMUSCULAR

## 2015-03-03 MED ORDER — TRIAMCINOLONE ACETONIDE 0.1 % EX CREA: 1.0000 "application " | TOPICAL_CREAM | Freq: Two times a day (BID) | CUTANEOUS | Status: DC

## 2015-03-03 MED ORDER — METFORMIN HCL 500 MG PO TABS: 500.0000 mg | ORAL_TABLET | Freq: Two times a day (BID) | ORAL | Status: DC

## 2015-03-03 NOTE — Progress Notes (Signed)
Pre visit review using our clinic review tool, if applicable. No additional management support is needed unless otherwise documented below in the visit note. 

## 2015-03-03 NOTE — Patient Instructions (Signed)
Pain in joint, ankle and foot Will get uric acid and cbc. Toradol 60 mg im today. Will get ankle xray now. Pt had mild sugar elevation in the past. Will get a1-c. Depending on uric acid level and a1-c may rx prednisone. Plan pending labs.    Follow up in 14 days or as needed(worse or changing signs or symptoms).

## 2015-03-03 NOTE — Assessment & Plan Note (Addendum)
Will get uric acid and cbc. Toradol 60 mg im today. Will get ankle xray now. Pt had mild sugar elevation in the past. Will get a1-c. Depending on uric acid level and a1-c may rx prednisone. Plan pending labs.

## 2015-03-03 NOTE — Progress Notes (Signed)
   Subjective:    Patient ID: Barbara Buchanan, female    DOB: 1950-12-03, 64 y.o.   MRN: [id number]  HPI  Pt in with left foot pain and ankle . Pain for 4 days. It is hurting for her to walk. Pt has some great toe mtp joint pain. Pt has some uric acid elevation in past. Now pain top of foot all the way to ankle. No fall or trauma. Pt has been on allopurinol.    Review of Systems  Constitutional: Negative for fever, chills, diaphoresis, activity change and fatigue.  Respiratory: Negative for cough, chest tightness and shortness of breath.   Cardiovascular: Negative for chest pain, palpitations and leg swelling.  Gastrointestinal: Negative for nausea, vomiting and abdominal pain.  Musculoskeletal: Negative for neck pain and neck stiffness.       Rt foot and ankle pain.  Neurological: Negative for dizziness, tremors, seizures, syncope, facial asymmetry, speech difficulty, weakness, light-headedness, numbness and headaches.  Psychiatric/Behavioral: Negative for behavioral problems, confusion and agitation. The patient is not nervous/anxious.        Objective:   Physical Exam  General- No acute distress. Pleasant patient. Neck- Full range of motion, no jvd Lungs- Clear, even and unlabored. Heart- regular rate and rhythm. Neurologic- CNII- XII grossly intact. Lt ankle- mild pain on palpation. Lt foot- faint pain on palption. Not swollen or warm      Assessment & Plan:

## 2015-03-13 ENCOUNTER — Ambulatory Visit: Payer: 59 | Admitting: Medical

## 2015-03-14 ENCOUNTER — Telehealth: Payer: Self-pay | Admitting: Medical

## 2015-03-14 NOTE — Telephone Encounter (Signed)
Pt no show 03/13/15 1:15pm, acute, called 03/14/15 and rescheduled for 03/17/15, charge?

## 2015-03-17 ENCOUNTER — Encounter: Payer: Self-pay | Admitting: Medical

## 2015-03-17 ENCOUNTER — Ambulatory Visit (INDEPENDENT_AMBULATORY_CARE_PROVIDER_SITE_OTHER): Payer: 59 | Admitting: Medical

## 2015-03-17 VITALS — BP 148/85 | HR 92 | Temp 98.1°F | Ht 59.0 in | Wt 125.6 lb

## 2015-03-17 DIAGNOSIS — R197 Diarrhea, unspecified: Secondary | ICD-10-CM

## 2015-03-17 DIAGNOSIS — H04203 Unspecified epiphora, bilateral lacrimal glands: Secondary | ICD-10-CM | POA: Insufficient documentation

## 2015-03-17 DIAGNOSIS — M25572 Pain in left ankle and joints of left foot: Secondary | ICD-10-CM

## 2015-03-17 LAB — COMPREHENSIVE METABOLIC PANEL
ALBUMIN: 4.4 g/dL (ref 3.5–5.2)
ALT: 39 U/L — AB (ref 0–35)
AST: 24 U/L (ref 0–37)
Alkaline Phosphatase: 151 U/L — ABNORMAL HIGH (ref 39–117)
BILIRUBIN TOTAL: 0.5 mg/dL (ref 0.2–1.2)
BUN: 10 mg/dL (ref 6–23)
CHLORIDE: 103 meq/L (ref 96–112)
CO2: 27 meq/L (ref 19–32)
Calcium: 9.8 mg/dL (ref 8.4–10.5)
Creatinine, Ser: 0.63 mg/dL (ref 0.40–1.20)
GFR: 101.73 mL/min (ref >60.00)
GLUCOSE: 113 mg/dL — AB (ref 70–99)
POTASSIUM: 4.1 meq/L (ref 3.5–5.1)
Sodium: 137 mEq/L (ref 135–145)
TOTAL PROTEIN: 7.6 g/dL (ref 6.0–8.3)

## 2015-03-17 NOTE — Progress Notes (Signed)
Pre visit review using our clinic review tool, if applicable. No additional management support is needed unless otherwise documented below in the visit note. 

## 2015-03-17 NOTE — Patient Instructions (Addendum)
Diarrhea That coincided with use of metformin and then stopped when metformin stopped. A1-c was not real high. So I am stressing low sugar diet and exercise. Early sept 7, 2016 approximate follow up appointment and repeat a1-c at that time. Stay off of metformin. Check cmp today.  Tenosynovitis of foot and ankle Will refer her back to her podiatry.  Watery eyes Chronic problem. Pt is going back to optometrist who has been treating her. If they can't solve problem and her symptom persist the pt instructed to  let me know and will refer to opthalmologist.    Follow up June 04, 2015 or as needed

## 2015-03-17 NOTE — Assessment & Plan Note (Signed)
Will refer her back to her podiatry.

## 2015-03-17 NOTE — Assessment & Plan Note (Addendum)
Chronic problem. Pt is going back to optometrist who has been treating her. If they can't solve problem and her symptom persist the pt instructed to  let me know and will refer to opthalmologist.

## 2015-03-17 NOTE — Progress Notes (Signed)
   Subjective:    Patient ID: Barbara Buchanan, female    DOB: 1951/03/04, 64 y.o.   MRN: [id number]  HPI  Pt in with report that her diarrhea stopped. Pt states diarrhea started about 2 wks ago. She attributed this to metformin and diclofenac. She stopped medication on past Tuesday and her diarrhea stopped. Pt a1-c was 7.0.  Pt states diarrhea was all day on and off.   At time she was quite severe fatigue. But since starting   Pt still has some foot pain. She has mentioned this in the past. Her uric acid is now down. She has seen Dr Elige Ko [phone]. Pt pain is in left foot. Pain is in left metarsal area. Mild swelling in past on 03-03-2015. Xrays were negative.  Pt has history of eye dc. Pt has seen optometrist. That optometrist tx did not work. She will go back to them. If she needs a referral they will call us and will make a referral.    Review of Systems  Constitutional: Negative for fever, chills, diaphoresis, activity change and fatigue.  Eyes: Positive for discharge. Negative for photophobia, pain, redness, itching and visual disturbance.  Respiratory: Negative for cough, chest tightness and shortness of breath.   Cardiovascular: Negative for chest pain, palpitations and leg swelling.  Gastrointestinal: Positive for diarrhea. Negative for nausea, vomiting and abdominal pain.       See hpi now resolved.  Musculoskeletal: Negative for neck pain and neck stiffness.       Left foot pain  Neurological: Negative for dizziness, tremors, seizures, syncope, facial asymmetry, speech difficulty, weakness, light-headedness, numbness and headaches.  Hematological: Negative for adenopathy. Does not bruise/bleed easily.  Psychiatric/Behavioral: Negative for behavioral problems, confusion and agitation. The patient is not nervous/anxious.        Objective:   Physical Exam  General Appearance- Not in acute distress.  HEENT Eyes- Scleraeral/Conjuntiva-bilat- Not Yellow. Conjunctiva clear.  No matting. No discharge. Lt medial canthus area. Faint swollen. Mouth & Throat- Normal.  Chest and Lung Exam Auscultation: Breath sounds:-Normal. Adventitious sounds:- No Adventitious sounds.  Cardiovascular Auscultation:Rythm - Regular. Heart Sounds -Normal heart sounds.  Abdomen Inspection:-Inspection Normal.  Palpation/Perucssion: Palpation and Percussion of the abdomen reveal- Non Tender, No Rebound tenderness, No rigidity(Guarding) and No Palpable abdominal masses.  Liver:-Normal.  Spleen:- Normal.    Skin- moist. No tenting. Lt foot- mild pain to palpation distal metatarsal region.      Assessment & Plan:

## 2015-03-17 NOTE — Telephone Encounter (Signed)
No charge. 

## 2015-03-17 NOTE — Assessment & Plan Note (Signed)
That coincided with use of metformin and then stopped when metformin stopped. A1-c was not real high. So I am stressing low sugar diet and exercise. Early sept 7, 2016 approximate follow up appointment and repeat a1-c at that time. Stay off of metformin. Check cmp today.

## 2015-03-19 ENCOUNTER — Ambulatory Visit: Payer: Self-pay | Admitting: Podiatry

## 2015-06-09 ENCOUNTER — Telehealth: Payer: Self-pay | Admitting: Medical

## 2015-06-09 ENCOUNTER — Encounter: Payer: Self-pay | Admitting: Medical

## 2015-06-09 ENCOUNTER — Ambulatory Visit (HOSPITAL_BASED_OUTPATIENT_CLINIC_OR_DEPARTMENT_OTHER)
Admission: RE | Admit: 2015-06-09 | Discharge: 2015-06-09 | Disposition: A | Discharge status: Home / Self Care | Payer: 59 | Source: Ambulatory Visit | Attending: Medical | Admitting: Medical

## 2015-06-09 ENCOUNTER — Ambulatory Visit (INDEPENDENT_AMBULATORY_CARE_PROVIDER_SITE_OTHER): Payer: 59 | Admitting: Medical

## 2015-06-09 VITALS — BP 130/80 | HR 68 | Temp 97.7°F | Resp 16 | Ht 59.0 in | Wt 129.6 lb

## 2015-06-09 DIAGNOSIS — I10 Essential (primary) hypertension: Secondary | ICD-10-CM | POA: Diagnosis not present

## 2015-06-09 DIAGNOSIS — E785 Hyperlipidemia, unspecified: Secondary | ICD-10-CM | POA: Diagnosis not present

## 2015-06-09 DIAGNOSIS — E119 Type 2 diabetes mellitus without complications: Secondary | ICD-10-CM

## 2015-06-09 DIAGNOSIS — M1000 Idiopathic gout, unspecified site: Secondary | ICD-10-CM

## 2015-06-09 DIAGNOSIS — M47814 Spondylosis without myelopathy or radiculopathy, thoracic region: Secondary | ICD-10-CM | POA: Insufficient documentation

## 2015-06-09 DIAGNOSIS — M546 Pain in thoracic spine: Secondary | ICD-10-CM | POA: Insufficient documentation

## 2015-06-09 DIAGNOSIS — M8580 Other specified disorders of bone density and structure, unspecified site: Secondary | ICD-10-CM | POA: Insufficient documentation

## 2015-06-09 LAB — CBC WITH DIFFERENTIAL/PLATELET
Basophils Absolute: 0 10*3/uL (ref 0.0–0.1)
Basophils Relative: 0.7 % (ref 0.0–3.0)
EOS PCT: 3.1 % (ref 0.0–5.0)
Eosinophils Absolute: 0.2 10*3/uL (ref 0.0–0.7)
HEMATOCRIT: 39 % (ref 36.0–46.0)
HEMOGLOBIN: 12.8 g/dL (ref 12.0–15.0)
LYMPHS PCT: 39.5 % (ref 12.0–46.0)
Lymphs Abs: 2.1 10*3/uL (ref 0.7–4.0)
MCHC: 32.9 g/dL (ref 30.0–36.0)
MCV: 93.5 fl (ref 78.0–100.0)
Monocytes Absolute: 0.4 10*3/uL (ref 0.1–1.0)
Monocytes Relative: 6.7 % (ref 3.0–12.0)
Neutro Abs: 2.7 10*3/uL (ref 1.4–7.7)
Neutrophils Relative %: 50 % (ref 43.0–77.0)
Platelets: 365 10*3/uL (ref 150.0–400.0)
RBC: 4.17 Mil/uL (ref 3.87–5.11)
RDW: 14 % (ref 11.5–15.5)
WBC: 5.4 10*3/uL (ref 4.0–10.5)

## 2015-06-09 LAB — COMPREHENSIVE METABOLIC PANEL
ALBUMIN: 4 g/dL (ref 3.5–5.2)
ALK PHOS: 103 U/L (ref 39–117)
ALT: 14 U/L (ref 0–35)
AST: 13 U/L (ref 0–37)
BUN: 11 mg/dL (ref 6–23)
CALCIUM: 9.4 mg/dL (ref 8.4–10.5)
CHLORIDE: 107 meq/L (ref 96–112)
CO2: 30 mEq/L (ref 19–32)
Creatinine, Ser: 0.45 mg/dL (ref 0.40–1.20)
GFR: 149.89 mL/min (ref >60.00)
Glucose, Bld: 106 mg/dL — ABNORMAL HIGH (ref 70–99)
Potassium: 4.1 mEq/L (ref 3.5–5.1)
Sodium: 143 mEq/L (ref 135–145)
TOTAL PROTEIN: 7.2 g/dL (ref 6.0–8.3)
Total Bilirubin: 0.4 mg/dL (ref 0.2–1.2)

## 2015-06-09 LAB — LIPID PANEL
CHOL/HDL RATIO: 4
Cholesterol: 218 mg/dL — ABNORMAL HIGH (ref 0–200)
HDL: 50.5 mg/dL (ref >39.00)
LDL CALC: 143 mg/dL — AB (ref 0–99)
NonHDL: 167.72
TRIGLYCERIDES: 125 mg/dL (ref 0.0–149.0)
VLDL: 25 mg/dL (ref 0.0–40.0)

## 2015-06-09 LAB — HEMOGLOBIN A1C: HEMOGLOBIN A1C: 6.7 % — AB (ref 4.6–6.5)

## 2015-06-09 LAB — MICROALBUMIN, URINE: Microalb, Ur: <0.2 mg/dL (ref <2.0)

## 2015-06-09 MED ORDER — LISINOPRIL 10 MG PO TABS: 10.0000 mg | ORAL_TABLET | Freq: Every day | ORAL | Status: DC

## 2015-06-09 MED ORDER — DICLOFENAC SODIUM 75 MG PO TBEC: 75.0000 mg | DELAYED_RELEASE_TABLET | Freq: Two times a day (BID) | ORAL | Status: DC

## 2015-06-09 MED ORDER — ROSUVASTATIN CALCIUM 20 MG PO TABS: 20.0000 mg | ORAL_TABLET | Freq: Every day | ORAL | Status: DC

## 2015-06-09 MED ORDER — ALLOPURINOL 100 MG PO TABS: 100.0000 mg | ORAL_TABLET | Freq: Every day | ORAL | Status: DC

## 2015-06-09 NOTE — Progress Notes (Signed)
Pre visit review using our clinic review tool, if applicable. No additional management support is needed unless otherwise documented below in the visit note. 

## 2015-06-09 NOTE — Telephone Encounter (Signed)
Placed order for bone density test. Order vitamin d placed as well. Would you ask radiology down stairs to look into epic or ask pt if pt ever got dexascan. I placed order for bone density in march. Looks like she never got that done. I could not find that report.  Talk to her friend that comes with her. She translates for her.

## 2015-06-09 NOTE — Telephone Encounter (Signed)
rx refill crestor.

## 2015-06-09 NOTE — Patient Instructions (Addendum)
Continue your lisinopril. Rx today. Bp level good today and recently per your report.  Get lipid panel today. Will plan to restart crestor.   Follow a1-c today. See if other medication needed since you could not tolerate metformin.  Get xray of tspine today.Rx diclofenac.  Refill allopurinol for gout.  Follow up in 3 months or as needed.  Pt came with translator.

## 2015-06-09 NOTE — Progress Notes (Signed)
Subjective:    Patient ID: Barbara Buchanan, female    DOB: 10-05-1950, 64 y.o.   MRN: [id number]  HPI   Pt in for follow up. Pt has htn and hyperlipidemia.  Pt has not been crestor. She thought I told her to stop crestor. I don't believe that was the case. Pt denies any diffuse myalgia. Pt is fasting.  Pt blood pressure good today. When she check at home also is fine.  No cardiac or neurologic signs or symptoms.  Pt a1-c was borderline 3 months ago. She could not tolerate metformin due to diarrhea. She is eating less overall. Eating a little better over all. Exercise limited. Only walks a little bit.  Pt also mentioned some mid t-spine pain for one week. No fall or injury. She mentioned lying supine today on exam. When changes position.  Pt is taking her gout medicine. Not reporting recent arthralgias.    Review of Systems  Constitutional: Negative for fever, chills, diaphoresis, activity change and fatigue.  Respiratory: Negative for cough, chest tightness, shortness of breath and wheezing.   Cardiovascular: Negative for chest pain, palpitations and leg swelling.  Gastrointestinal: Negative for nausea, vomiting and abdominal pain.  Musculoskeletal: Positive for back pain. Negative for arthralgias, neck pain and neck stiffness.  Neurological: Negative for dizziness, tremors, seizures, syncope, facial asymmetry, speech difficulty, weakness, light-headedness, numbness and headaches.  Psychiatric/Behavioral: Negative for behavioral problems, confusion and agitation. The patient is not nervous/anxious.      Past Medical History  Diagnosis Date  . Cataract   . Depression   . Hypertension   . Osteoporosis     Social History   Social History  . Marital Status: Single    Spouse Name: N/A  . Number of Children: N/A  . Years of Education: N/A   Occupational History  . Not on file.   Social History Main Topics  . Smoking status: Former Smoker    Quit date: 10/21/2009  .  Smokeless tobacco: Never Used  . Alcohol Use: Yes  . Drug Use: No  . Sexual Activity: Not on file   Other Topics Concern  . Not on file   Social History Narrative    No past surgical history on file.  Family History  Problem Relation Age of Onset  . Stroke Mother     Allergies  Allergen Reactions  . Penicillins     Current Outpatient Prescriptions on File Prior to Visit  Medication Sig Dispense Refill  . lisinopril (PRINIVIL,ZESTRIL) 10 MG tablet Take 1 tablet (10 mg total) by mouth daily. 30 tablet 5  . rosuvastatin (CRESTOR) 20 MG tablet Take 1 tablet (20 mg total) by mouth daily. 30 tablet 3   No current facility-administered medications on file prior to visit.    BP 130/80 mmHg  Pulse 68  Temp(Src) 97.7 F (36.5 C) (Oral)  Resp 16  Ht  (1.499 m)  Wt 129 lb 9.6 oz (58.786 kg)  BMI 26.16 kg/m2  SpO2 98%       Objective:   Physical Exam  General Mental Status- Alert. General Appearance- Not in acute distress.   Skin General: Color- Normal Color. Moisture- Normal Moisture.  Neck Carotid Arteries- Normal color. Moisture- Normal Moisture. No carotid bruits. No JVD.  Chest and Lung Exam Auscultation: Breath Sounds:-Normal.  Cardiovascular Auscultation:Rythm- Regular. Murmurs & Other Heart Sounds:Auscultation of the heart reveals- No Murmurs.  Abdomen Inspection:-Inspeection Normal. Palpation/Percussion:Note:No mass. Palpation and Percussion of the abdomen reveal- Non Tender, Non Distended +  BS, no rebound or guarding.    Neurologic Cranial Nerve exam:- CN III-XII intact(No nystagmus), symmetric smile. Strength:- 5/5 equal and symmetric strength both upper and lower extremities.  Lower ext- see quality metrics.  Back- mid thoracic pain. Lying supine.      Assessment & Plan:  Continue your lisinopril. Rx today. Bp level good today and recently per your report.  Get lipid panel today. Will plan to restart crestor.   Follow a1-c  today. See if other medication needed since you could not tolerate metformin.  Get xray of tspine today.Rx diclofenac.  Refill allopurinol for gout.  Follow up in 3 months or as needed.

## 2015-06-10 NOTE — Telephone Encounter (Signed)
Spoke with pt's friend and she voices understanding.

## 2015-06-16 ENCOUNTER — Ambulatory Visit (HOSPITAL_BASED_OUTPATIENT_CLINIC_OR_DEPARTMENT_OTHER)
Admission: RE | Admit: 2015-06-16 | Discharge: 2015-06-16 | Disposition: A | Discharge status: Home / Self Care | Payer: 59 | Source: Ambulatory Visit | Attending: Medical | Admitting: Medical

## 2015-06-16 DIAGNOSIS — Z0000 Encounter for general adult medical examination without abnormal findings: Secondary | ICD-10-CM

## 2015-06-16 DIAGNOSIS — Z13820 Encounter for screening for osteoporosis: Secondary | ICD-10-CM | POA: Insufficient documentation

## 2015-06-16 DIAGNOSIS — Z0189 Encounter for other specified special examinations: Secondary | ICD-10-CM | POA: Diagnosis not present

## 2015-06-16 DIAGNOSIS — M810 Age-related osteoporosis without current pathological fracture: Secondary | ICD-10-CM | POA: Insufficient documentation

## 2015-06-17 ENCOUNTER — Telehealth: Payer: Self-pay | Admitting: Medical

## 2015-06-17 MED ORDER — ALENDRONATE SODIUM 10 MG PO TABS: 10.0000 mg | ORAL_TABLET | Freq: Every day | ORAL | Status: DC

## 2015-06-17 NOTE — Telephone Encounter (Signed)
Pt has osteoporosis. She needs to be on medicine to help stop this. Will rx fosamax tab. She needs to get vitamin d level which I ordered. Also needs to start calcium supplements otc . Follow up in 2 months. Talk to her friend which  Is her translator. She speaks [place].

## 2015-06-18 NOTE — Telephone Encounter (Signed)
Spoke with pt's friend and she voices understanding. I will send a paper copy of the results to the pt.

## 2015-06-18 NOTE — Telephone Encounter (Signed)
Attempted to reach pt and VM is not set  Up. Will try again later.

## 2015-06-24 ENCOUNTER — Telehealth: Payer: Self-pay | Admitting: Medical

## 2015-06-24 NOTE — Telephone Encounter (Signed)
Attempted to reach pt and VM is not set up. Will try again later.  

## 2015-06-24 NOTE — Telephone Encounter (Signed)
Caller name: BREELEY BISCHOF   Relationship to patient: Self   Can be reached: [phone]  Pharmacy: [id number] - [place], [place] - [address]  Reason for call: pt called in because she states that her pharmacy states that they didn't receive a refill script on her BP medication. Asked pt the name of her medication, the pt yelled at me and said that she doesn't know but we need to fix it.   Please advise.

## 2015-06-24 NOTE — Telephone Encounter (Signed)
Spoke with Susy Frizzle at pharmacy and Verbally gave the order per ES.

## 2015-06-24 NOTE — Telephone Encounter (Signed)
Spoke with pt and she voices understanding.  

## 2015-07-02 ENCOUNTER — Other Ambulatory Visit: Payer: Self-pay | Admitting: Medical

## 2015-09-08 ENCOUNTER — Ambulatory Visit: Payer: 59 | Admitting: Medical

## 2015-09-25 ENCOUNTER — Ambulatory Visit: Payer: 59 | Admitting: Medical

## 2015-10-14 ENCOUNTER — Other Ambulatory Visit: Payer: Self-pay | Admitting: Medical

## 2015-10-20 ENCOUNTER — Ambulatory Visit (INDEPENDENT_AMBULATORY_CARE_PROVIDER_SITE_OTHER): Payer: BLUE CROSS/BLUE SHIELD | Admitting: Medical

## 2015-10-20 ENCOUNTER — Encounter: Payer: Self-pay | Admitting: Medical

## 2015-10-20 ENCOUNTER — Telehealth: Payer: Self-pay | Admitting: Medical

## 2015-10-20 VITALS — BP 126/76 | HR 94 | Temp 98.0°F | Ht 59.0 in | Wt 130.2 lb

## 2015-10-20 DIAGNOSIS — M810 Age-related osteoporosis without current pathological fracture: Secondary | ICD-10-CM

## 2015-10-20 DIAGNOSIS — Z23 Encounter for immunization: Secondary | ICD-10-CM | POA: Diagnosis not present

## 2015-10-20 DIAGNOSIS — E785 Hyperlipidemia, unspecified: Secondary | ICD-10-CM | POA: Diagnosis not present

## 2015-10-20 DIAGNOSIS — M8580 Other specified disorders of bone density and structure, unspecified site: Secondary | ICD-10-CM

## 2015-10-20 DIAGNOSIS — E119 Type 2 diabetes mellitus without complications: Secondary | ICD-10-CM | POA: Diagnosis not present

## 2015-10-20 DIAGNOSIS — I10 Essential (primary) hypertension: Secondary | ICD-10-CM

## 2015-10-20 LAB — HEMOGLOBIN A1C: Hgb A1c MFr Bld: 7 % — ABNORMAL HIGH (ref 4.6–6.5)

## 2015-10-20 LAB — VITAMIN D 25 HYDROXY (VIT D DEFICIENCY, FRACTURES): VITD: 14.24 ng/mL — AB (ref 30.00–100.00)

## 2015-10-20 MED ORDER — VITAMIN D (ERGOCALCIFEROL) 1.25 MG (50000 UNIT) PO CAPS: 50000.0000 [IU] | ORAL_CAPSULE | ORAL | Status: DC

## 2015-10-20 MED ORDER — LISINOPRIL 10 MG PO TABS: 10.0000 mg | ORAL_TABLET | Freq: Every day | ORAL | Status: DC

## 2015-10-20 MED ORDER — CANAGLIFLOZIN 100 MG PO TABS: 100.0000 mg | ORAL_TABLET | Freq: Every day | ORAL | Status: DC

## 2015-10-20 NOTE — Addendum Note (Signed)
Addended by: Neldon Labella on: 10/20/2015 02:21 PM   Modules accepted: Orders

## 2015-10-20 NOTE — Patient Instructions (Addendum)
Your blood pressure is well controlled today. Will advise continue same meds. Refilled lisinopril today.  For ankle pain which is good/controlled today recommend staying on allopurinol.  For diabetes history will get a1-c today.  For osteoporosis. Will get vitamin D level. She will stay on alondrenate.  We gave you flu vaccine and pneumonia vaccine today.    Follow up in 3 month or sooner depending on lab review and how you feel.

## 2015-10-20 NOTE — Progress Notes (Signed)
Pre visit review using our clinic review tool, if applicable. No additional management support is needed unless otherwise documented below in the visit note. 

## 2015-10-20 NOTE — Progress Notes (Signed)
Subjective:    Patient ID: Barbara Buchanan, female    DOB: 1951/02/05, 65 y.o.   MRN: [id number]  HPI Pt blood pressure is doing Medha. No cardiac or neurologic signs or symptoms. She needs refill of her lisinopril.  Pt complains of getting to many bills. Pt keeps getting bills from prior doctors. I asked her translator to call and ask about the bills.   Pt has some hx of ankle pain. Soft tissue swelling on xray left side. Last visit. Still has occasional pain. Will recommend ace wrap. Pt declines any referral due to potential.  Pt has elevated blood sugar and elevated a1-c in the past. She could not tolerate metformin. Will check a1-c today.  Pt uric acid was good int the summer and she is on allopurinol.  Pt has osteoporosis will get vitamin D level today.      Review of Systems  Constitutional: Negative for fever, chills, diaphoresis and fatigue.  HENT: Negative for congestion and drooling.   Respiratory: Negative for cough and chest tightness.   Cardiovascular: Negative for chest pain and palpitations.  Genitourinary: Negative for dysuria and difficulty urinating.  Neurological: Negative for dizziness, seizures, syncope, speech difficulty, weakness, light-headedness and headaches.  Hematological: Negative for adenopathy. Does not bruise/bleed easily.     Past Medical History  Diagnosis Date  . Cataract   . Depression   . Hypertension   . Osteoporosis     Social History   Social History  . Marital Status: Single    Spouse Name: N/A  . Number of Children: N/A  . Years of Education: N/A   Occupational History  . Not on file.   Social History Main Topics  . Smoking status: Former Smoker    Quit date: 10/21/2009  . Smokeless tobacco: Never Used  . Alcohol Use: Yes  . Drug Use: No  . Sexual Activity: Not on file   Other Topics Concern  . Not on file   Social History Narrative    No past surgical history on file.  Family History  Problem Relation Age of Onset  .  Stroke Mother     Allergies  Allergen Reactions  . Penicillins     Current Outpatient Prescriptions on File Prior to Visit  Medication Sig Dispense Refill  . alendronate (FOSAMAX) 10 MG tablet Take 1 tablet (10 mg total) by mouth daily before breakfast. Take with a full glass of water on an empty stomach. 30 tablet 11  . allopurinol (ZYLOPRIM) 100 MG tablet take 1 tablet by mouth once daily 30 tablet 3  . CRESTOR 20 MG tablet take 1 tablet by mouth once daily 30 tablet 3  . diclofenac (VOLTAREN) 75 MG EC tablet Take 1 tablet (75 mg total) by mouth 2 (two) times daily. 30 tablet 0   No current facility-administered medications on file prior to visit.    BP 126/76 mmHg  Pulse 94  Temp(Src) 98 F (36.7 C) (Oral)  Ht  (1.499 m)  Wt 130 lb 3.2 oz (59.058 kg)  BMI 26.28 kg/m2  SpO2 97%       Objective:   Physical Exam  General Mental Status- Alert. General Appearance- Not in acute distress.   Skin General: Color- Normal Color. Moisture- Normal Moisture.  Neck Carotid Arteries- Normal color. Moisture- Normal Moisture. No carotid bruits. No JVD.  Chest and Lung Exam Auscultation: Breath Sounds:-Normal.  Cardiovascular Auscultation:Rythm- Regular. Murmurs & Other Heart Sounds:Auscultation of the heart reveals- No Murmurs.  Abdomen Inspection:-Inspeection  Normal. Palpation/Percussion:Note:No mass. Palpation and Percussion of the abdomen reveal- Non Tender, Non Distended + BS, no rebound or guarding.    Neurologic Cranial Nerve exam:- CN III-XII intact(No nystagmus), symmetric smile. Strength:- 5/5 equal and symmetric strength both upper and lower extremities.  See quality metrics on foot exam  Both ankles- no tenderness to palpation today(pt given ace wrap if needed for recrruent ankle pain.     Assessment & Plan:  Your blood pressure is well controlled today. Will advise continue same meds. Refilled lisinopril today.  For ankle pain which is  good/controlled today recommend staying on allopurinol.  For diabetes history will get a1-c today.  For osteoporosis. Will get vitamin D level. She will stay on alondrenate.  We gave you flu vaccine and pneumonia vaccine today.

## 2015-10-20 NOTE — Telephone Encounter (Signed)
I sent in rx ergocalciferol. She can start tabs and then repeat vitamin d in 12 weeks. Also will send in invokanna low dose for diabetes.

## 2015-10-23 ENCOUNTER — Telehealth: Payer: Self-pay | Admitting: Medical

## 2015-10-23 DIAGNOSIS — E559 Vitamin D deficiency, unspecified: Secondary | ICD-10-CM

## 2015-10-23 DIAGNOSIS — E785 Hyperlipidemia, unspecified: Secondary | ICD-10-CM

## 2015-10-23 DIAGNOSIS — E119 Type 2 diabetes mellitus without complications: Secondary | ICD-10-CM

## 2015-10-23 NOTE — Telephone Encounter (Signed)
I talked with pt she only wants to try diet for her diabetes. Will exercise. She will come in for vitamin d level, lipid panel and a1-c in 3 months. Will you make sure future order placed for all 3.

## 2015-10-23 NOTE — Telephone Encounter (Signed)
Spoke with pt and she does not want to take the blood sugar medication and she wants to start with diet and exercise and may consider taking the medication after a couple months. Please advise.

## 2015-10-23 NOTE — Telephone Encounter (Signed)
Pt has questions about diabetic meds and coming back for labs in 1 week? [phone]

## 2015-10-24 NOTE — Telephone Encounter (Signed)
Put order in for labs in 3 months.

## 2015-10-27 ENCOUNTER — Ambulatory Visit: Payer: BLUE CROSS/BLUE SHIELD | Admitting: Medical

## 2016-01-22 ENCOUNTER — Other Ambulatory Visit (INDEPENDENT_AMBULATORY_CARE_PROVIDER_SITE_OTHER): Payer: BLUE CROSS/BLUE SHIELD

## 2016-01-22 DIAGNOSIS — E559 Vitamin D deficiency, unspecified: Secondary | ICD-10-CM

## 2016-01-22 DIAGNOSIS — E785 Hyperlipidemia, unspecified: Secondary | ICD-10-CM | POA: Diagnosis not present

## 2016-01-22 DIAGNOSIS — E119 Type 2 diabetes mellitus without complications: Secondary | ICD-10-CM | POA: Diagnosis not present

## 2016-01-22 LAB — VITAMIN D 25 HYDROXY (VIT D DEFICIENCY, FRACTURES): VITD: 28.36 ng/mL — ABNORMAL LOW (ref 30.00–100.00)

## 2016-01-22 LAB — LIPID PANEL
CHOL/HDL RATIO: 2
Cholesterol: 123 mg/dL (ref 0–200)
HDL: 54.1 mg/dL (ref >39.00)
LDL Cholesterol: 44 mg/dL (ref 0–99)
NonHDL: 69.01
TRIGLYCERIDES: 123 mg/dL (ref 0.0–149.0)
VLDL: 24.6 mg/dL (ref 0.0–40.0)

## 2016-01-22 LAB — HEMOGLOBIN A1C: HEMOGLOBIN A1C: 7.2 % — AB (ref 4.6–6.5)

## 2016-01-26 ENCOUNTER — Encounter: Payer: Self-pay | Admitting: Medical

## 2016-01-26 ENCOUNTER — Ambulatory Visit (INDEPENDENT_AMBULATORY_CARE_PROVIDER_SITE_OTHER): Payer: BLUE CROSS/BLUE SHIELD | Admitting: Medical

## 2016-01-26 VITALS — BP 120/76 | HR 78 | Temp 98.0°F | Ht 59.0 in | Wt 132.4 lb

## 2016-01-26 DIAGNOSIS — E119 Type 2 diabetes mellitus without complications: Secondary | ICD-10-CM

## 2016-01-26 DIAGNOSIS — M1000 Idiopathic gout, unspecified site: Secondary | ICD-10-CM

## 2016-01-26 DIAGNOSIS — I10 Essential (primary) hypertension: Secondary | ICD-10-CM | POA: Diagnosis not present

## 2016-01-26 DIAGNOSIS — M810 Age-related osteoporosis without current pathological fracture: Secondary | ICD-10-CM

## 2016-01-26 DIAGNOSIS — E559 Vitamin D deficiency, unspecified: Secondary | ICD-10-CM | POA: Diagnosis not present

## 2016-01-26 DIAGNOSIS — E785 Hyperlipidemia, unspecified: Secondary | ICD-10-CM | POA: Diagnosis not present

## 2016-01-26 DIAGNOSIS — M8580 Other specified disorders of bone density and structure, unspecified site: Secondary | ICD-10-CM

## 2016-01-26 MED ORDER — ALENDRONATE SODIUM 10 MG PO TABS: 10.0000 mg | ORAL_TABLET | Freq: Every day | ORAL | Status: DC

## 2016-01-26 MED ORDER — ALLOPURINOL 100 MG PO TABS: 100.0000 mg | ORAL_TABLET | Freq: Every day | ORAL | Status: DC

## 2016-01-26 MED ORDER — VITAMIN D (ERGOCALCIFEROL) 1.25 MG (50000 UNIT) PO CAPS: 50000.0000 [IU] | ORAL_CAPSULE | ORAL | Status: AC

## 2016-01-26 MED ORDER — ROSUVASTATIN CALCIUM 20 MG PO TABS: 20.0000 mg | ORAL_TABLET | Freq: Every day | ORAL | Status: DC

## 2016-01-26 NOTE — Progress Notes (Addendum)
Subjective:    Patient ID: Barbara Buchanan, female    DOB: 08-05-1951, 65 y.o.   MRN: [id number]  HPI  Pt has not been taking her diabetic medication. Her a1-c was 7.2 Pt is know willing to take medications. Pt did get invokana but it was not very expensive. She states she wanted to try diet alone approach first but now willing to take medication.  Pt vitamin was mild low and I sent in 3 weeks of vitmain d 50,000 units.   Pt is out of gout medicine. She does not report any gout flare.  Pt is out of fosamax. I will refill.   Pt blood pressure is well controlled.  Pt lipids were very well controlled recently on labs.   Pt is getting some exercise daily. 1 mile a day.     Review of Systems  Constitutional: Negative for fever, chills, diaphoresis, activity change and fatigue.  Respiratory: Negative for cough, chest tightness, shortness of breath and wheezing.   Cardiovascular: Negative for chest pain, palpitations and leg swelling.  Gastrointestinal: Negative for nausea, vomiting and abdominal pain.  Musculoskeletal: Negative for neck pain and neck stiffness.  Neurological: Negative for dizziness, facial asymmetry, speech difficulty, weakness, light-headedness and headaches.  Psychiatric/Behavioral: Negative for behavioral problems, confusion and agitation. The patient is not nervous/anxious.    Past Medical History  Diagnosis Date  . Cataract   . Depression   . Hypertension   . Osteoporosis      Social History   Social History  . Marital Status: Single    Spouse Name: N/A  . Number of Children: N/A  . Years of Education: N/A   Occupational History  . Not on file.   Social History Main Topics  . Smoking status: Former Smoker    Quit date: 10/21/2009  . Smokeless tobacco: Never Used  . Alcohol Use: Yes  . Drug Use: No  . Sexual Activity: Not on file   Other Topics Concern  . Not on file   Social History Narrative    No past surgical history on file.  Family  History  Problem Relation Age of Onset  . Stroke Mother     Allergies  Allergen Reactions  . Penicillins     Current Outpatient Prescriptions on File Prior to Visit  Medication Sig Dispense Refill  . alendronate (FOSAMAX) 10 MG tablet Take 1 tablet (10 mg total) by mouth daily before breakfast. Take with a full glass of water on an empty stomach. 30 tablet 11  . allopurinol (ZYLOPRIM) 100 MG tablet take 1 tablet by mouth once daily 30 tablet 3  . canagliflozin (INVOKANA) 100 MG TABS tablet Take 1 tablet (100 mg total) by mouth daily before breakfast. 30 tablet 3  . diclofenac (VOLTAREN) 75 MG EC tablet Take 1 tablet (75 mg total) by mouth 2 (two) times daily. 30 tablet 0  . lisinopril (PRINIVIL,ZESTRIL) 10 MG tablet Take 1 tablet (10 mg total) by mouth daily. 90 tablet 1   No current facility-administered medications on file prior to visit.    BP 120/76 mmHg  Pulse 78  Temp(Src) 98 F (36.7 C) (Oral)  Ht  (1.499 m)  Wt 132 lb 6.4 oz (60.056 kg)  BMI 26.73 kg/m2    Past Medical History  Diagnosis Date  . Cataract   . Depression   . Hypertension   . Osteoporosis      Social History   Social History  . Marital Status: Single  Spouse Name: N/A  . Number of Children: N/A  . Years of Education: N/A   Occupational History  . Not on file.   Social History Main Topics  . Smoking status: Former Smoker    Quit date: 10/21/2009  . Smokeless tobacco: Never Used  . Alcohol Use: Yes  . Drug Use: No  . Sexual Activity: Not on file   Other Topics Concern  . Not on file   Social History Narrative    No past surgical history on file.  Family History  Problem Relation Age of Onset  . Stroke Mother     Allergies  Allergen Reactions  . Penicillins     Current Outpatient Prescriptions on File Prior to Visit  Medication Sig Dispense Refill  . alendronate (FOSAMAX) 10 MG tablet Take 1 tablet (10 mg total) by mouth daily before breakfast. Take with a full  glass of water on an empty stomach. 30 tablet 11  . allopurinol (ZYLOPRIM) 100 MG tablet take 1 tablet by mouth once daily 30 tablet 3  . canagliflozin (INVOKANA) 100 MG TABS tablet Take 1 tablet (100 mg total) by mouth daily before breakfast. 30 tablet 3  . diclofenac (VOLTAREN) 75 MG EC tablet Take 1 tablet (75 mg total) by mouth 2 (two) times daily. 30 tablet 0  . lisinopril (PRINIVIL,ZESTRIL) 10 MG tablet Take 1 tablet (10 mg total) by mouth daily. 90 tablet 1   No current facility-administered medications on file prior to visit.    BP 120/76 mmHg  Pulse 78  Temp(Src) 98 F (36.7 C) (Oral)  Ht 4\' 11"  (1.499 m)  Wt 132 lb 6.4 oz (60.056 kg)  BMI 26.73 kg/m2       Objective:   Physical Exam  General Mental Status- Alert. General Appearance- Not in acute distress.   Skin General: Color- Normal Color. Moisture- Normal Moisture.  Neck Carotid Arteries- Normal color. Moisture- Normal Moisture. No carotid bruits. No JVD.  Chest and Lung Exam Auscultation: Breath Sounds:-Normal.  Cardiovascular Auscultation:Rythm- Regular. Murmurs & Other Heart Sounds:Auscultation of the heart reveals- No Murmurs.  Abdomen Inspection:-Inspeection Normal. Palpation/Percussion:Note:No mass. Palpation and Percussion of the abdomen reveal- Non Tender, Non Distended + BS, no rebound or guarding.    Neurologic Cranial Nerve exam:- CN III-XII intact(No nystagmus), symmetric smile. Strength:- 5/5 equal and symmetric strength both upper and lower extremities.      Assessment & Plan:  For your cholesterol continue your crestor.   For you low vitamin D. Start your rx vitamin d. Then will repeat vitamin d level in 3 months.  For diabetes please start your invokona since your a1-c was 7.2.  For gout will refill your allopurinol.  For htn continue your current bp medication.  For osteoporosis. Continue fosamax and vit d.  Follow up in 3 months or as needed  I did write a letter  stating that I did refer pt to cardiologist. She can present this to her insurance.  Ladonne Sharples, Ramon DredgeEdward, PA-C

## 2016-01-26 NOTE — Progress Notes (Signed)
Pre visit review using our clinic review tool, if applicable. No additional management support is needed unless otherwise documented below in the visit note. 

## 2016-01-26 NOTE — Patient Instructions (Addendum)
For your cholesterol continue your crestor.   For you low vitamin D. Start your rx vitamin d. Then will repeat vitamin d level in 3 months.  For diabetes please start your invokona since your a1-c was 7.2.  For gout will refill your allopurinol.  For htn continue your current bp medication.  For osteoporosis. Continue fosamax and vit d.  Follow up in 3 months or as needed

## 2016-02-03 IMAGING — NM NM MYOCAR MULTI W/ SPECT
3 series · 18 of 18 positions shown · non-contrast
Comparison: none

[Series 1: rest · 6.51mm/px · 6 of 64 frames shown]
[frame 6/64]
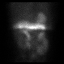
[frame 16/64]
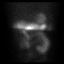
[frame 27/64]
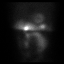
[frame 38/64]
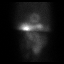
[frame 48/64]
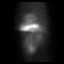
[frame 59/64]
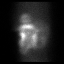

[Series 2: stress · 6.51mm/px · 6 of 512 frames shown (1 of 2)]
[frame 43/512]
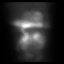
[frame 128/512]
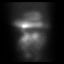
[frame 214/512]
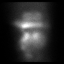
[frame 299/512]
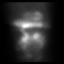
[frame 384/512]
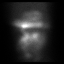
[frame 470/512]
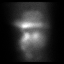

[Series 2: stress · 6.51mm/px · 6 of 64 frames shown (2 of 2)]
[frame 6/64]
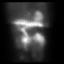
[frame 16/64]
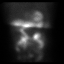
[frame 27/64]
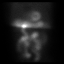
[frame 38/64]
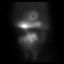
[frame 48/64]
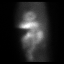
[frame 59/64]
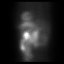

[18 of 18 positions shown; findings below may reference images not displayed]

Canned report from images found in remote index.

Refer to host system for actual result text.

## 2016-04-01 ENCOUNTER — Encounter: Payer: Self-pay | Admitting: Medical

## 2016-04-01 ENCOUNTER — Telehealth: Payer: Self-pay | Admitting: Medical

## 2016-04-01 ENCOUNTER — Ambulatory Visit (INDEPENDENT_AMBULATORY_CARE_PROVIDER_SITE_OTHER): Payer: BLUE CROSS/BLUE SHIELD | Admitting: Medical

## 2016-04-01 VITALS — BP 124/82 | HR 88 | Temp 98.2°F | Ht 59.0 in | Wt 130.8 lb

## 2016-04-01 DIAGNOSIS — M1000 Idiopathic gout, unspecified site: Secondary | ICD-10-CM | POA: Diagnosis not present

## 2016-04-01 DIAGNOSIS — E119 Type 2 diabetes mellitus without complications: Secondary | ICD-10-CM

## 2016-04-01 DIAGNOSIS — M810 Age-related osteoporosis without current pathological fracture: Secondary | ICD-10-CM | POA: Diagnosis not present

## 2016-04-01 DIAGNOSIS — I10 Essential (primary) hypertension: Secondary | ICD-10-CM

## 2016-04-01 DIAGNOSIS — R7989 Other specified abnormal findings of blood chemistry: Secondary | ICD-10-CM

## 2016-04-01 DIAGNOSIS — E785 Hyperlipidemia, unspecified: Secondary | ICD-10-CM | POA: Diagnosis not present

## 2016-04-01 MED ORDER — ROSUVASTATIN CALCIUM 20 MG PO TABS: 20.0000 mg | ORAL_TABLET | Freq: Every day | ORAL | Status: AC

## 2016-04-01 MED ORDER — ALENDRONATE SODIUM 10 MG PO TABS: 10.0000 mg | ORAL_TABLET | Freq: Every day | ORAL | Status: AC

## 2016-04-01 MED ORDER — ALLOPURINOL 100 MG PO TABS: 100.0000 mg | ORAL_TABLET | Freq: Every day | ORAL | Status: AC

## 2016-04-01 MED ORDER — LISINOPRIL 10 MG PO TABS
10.0000 mg | ORAL_TABLET | Freq: Every day | ORAL | Status: AC
Start: 2016-04-01 — End: ?

## 2016-04-01 MED ORDER — CANAGLIFLOZIN 100 MG PO TABS: 100.0000 mg | ORAL_TABLET | Freq: Every day | ORAL | Status: AC

## 2016-04-01 MED ORDER — DICLOFENAC SODIUM 75 MG PO TBEC
75.0000 mg | DELAYED_RELEASE_TABLET | Freq: Two times a day (BID) | ORAL | Status: AC
Start: 2016-04-01 — End: ?

## 2016-04-01 NOTE — Telephone Encounter (Signed)
By 3rd week of June would you call pt and verify that [place] has moved to [place]. At that point want me taken off as pcp if she has moved. Thanks.

## 2016-04-01 NOTE — Progress Notes (Signed)
Subjective:    Patient ID: Barbara Buchanan, female    DOB: 08-06-1951, 65 y.o.   MRN: [id number]  HPI  Pt in for evaluation. She states she will be moving to [place].   Pt states she would like her records and needs refillso her medications.   Pt feeling well today with no complaints.  Pt bp well controlled today. No cardiac or neurologic signs or symptoms.  Pt last cholesterol was improved.   Pt states she needs a letter stating that she was referred to GI for screening colonoscopy.  Pt plans to leave 15th of July for [place].     Review of Systems  Constitutional: Negative for fever, chills, diaphoresis, activity change and fatigue.  Respiratory: Negative for cough, chest tightness and shortness of breath.   Cardiovascular: Negative for chest pain, palpitations and leg swelling.  Gastrointestinal: Negative for nausea, vomiting and abdominal pain.  Musculoskeletal: Negative for neck pain and neck stiffness.  Neurological: Negative for dizziness, facial asymmetry, weakness, numbness and headaches.  Hematological: Negative for adenopathy.  Psychiatric/Behavioral: Negative for behavioral problems, confusion and agitation. The patient is not nervous/anxious.     Past Medical History  Diagnosis Date  . Cataract   . Depression   . Hypertension   . Osteoporosis      Social History   Social History  . Marital Status: Single    Spouse Name: N/A  . Number of Children: N/A  . Years of Education: N/A   Occupational History  . Not on file.   Social History Main Topics  . Smoking status: Former Smoker    Quit date: 10/21/2009  . Smokeless tobacco: Never Used  . Alcohol Use: Yes  . Drug Use: No  . Sexual Activity: Not on file   Other Topics Concern  . Not on file   Social History Narrative    No past surgical history on file.  Family History  Problem Relation Age of Onset  . Stroke Mother     Allergies  Allergen Reactions  . Penicillins     Current  Outpatient Prescriptions on File Prior to Visit  Medication Sig Dispense Refill  . alendronate (FOSAMAX) 10 MG tablet Take 1 tablet (10 mg total) by mouth daily before breakfast. Take with a full glass of water on an empty stomach. 30 tablet 11  . allopurinol (ZYLOPRIM) 100 MG tablet Take 1 tablet (100 mg total) by mouth daily. 30 tablet 3  . canagliflozin (INVOKANA) 100 MG TABS tablet Take 1 tablet (100 mg total) by mouth daily before breakfast. 30 tablet 3  . diclofenac (VOLTAREN) 75 MG EC tablet Take 1 tablet (75 mg total) by mouth 2 (two) times daily. 30 tablet 0  . lisinopril (PRINIVIL,ZESTRIL) 10 MG tablet Take 1 tablet (10 mg total) by mouth daily. 90 tablet 1  . rosuvastatin (CRESTOR) 20 MG tablet Take 1 tablet (20 mg total) by mouth daily. 30 tablet 5  . Vitamin D, Ergocalciferol, (DRISDOL) 50000 units CAPS capsule Take 1 capsule (50,000 Units total) by mouth every 7 (seven) days. 3 capsule 0   No current facility-administered medications on file prior to visit.    BP 124/82 mmHg  Pulse 88  Temp(Src) 98.2 F (36.8 C) (Oral)  Ht 4\' 11"  (1.499 m)  Wt 130 lb 12.8 oz (59.33 kg)  BMI 26.40 kg/m2  SpO2 97%       Objective:   Physical Exam  General Mental Status- Alert. General Appearance- Not in acute distress.  Skin General: Color- Normal Color. Moisture- Normal Moisture.  Neck Carotid Arteries- Normal color. Moisture- Normal Moisture. No carotid bruits. No JVD.  Chest and Lung Exam Auscultation: Breath Sounds:-Normal.  Cardiovascular Auscultation:Rythm- Regular. Murmurs & Other Heart Sounds:Auscultation of the heart reveals- No Murmurs.  . Neurologic Cranial Nerve exam:- CN III-XII intact(No nystagmus), symmetric smile. Strength:- 5/5 equal and symmetric strength both upper and lower extremities.     Assessment & Plan:  Your bp is well controlled today.   Most recent labs for diabetes show controlled a1c.  Most recent lipids is well controlled recent past  as well.  For osteoporosis refill you fosamax.  For gout refill your your allopurinol.   Will refill all of your medications for 90 days.  Please sign release form so we can send you records to your new PCP.  Follow up as needed before you leave for [place]. Sorry you are leaving. It was nice having you as a patient.  Aeriel Boulay, Ramon DredgeEdward, PA-C

## 2016-04-01 NOTE — Progress Notes (Signed)
Pre visit review using our clinic review tool, if applicable. No additional management support is needed unless otherwise documented below in the visit note. 

## 2016-04-01 NOTE — Patient Instructions (Addendum)
Your bp is well controlled today.   Most recent labs for diabetes show controlled a1c.  Most recent lipids is well controlled recent past as well.  For osteoporosis refill you fosamax.  For gout refill your your allopurinol.   Will refill all of your medications for 90 days.  Recheck vitamin D. Place order in.  Please sign release form so we can send you records to your new PCP.  Follow up as needed before you leave for [place]. Sorry you are leaving. It was nice having you as a patient.

## 2016-06-21 ENCOUNTER — Other Ambulatory Visit: Payer: Self-pay | Admitting: Medical

## 2016-07-28 ENCOUNTER — Other Ambulatory Visit: Payer: Self-pay | Admitting: Medical

## 2016-07-29 NOTE — Telephone Encounter (Signed)
Patient has moved to [place] no need for refill. TL/CMA

## 2018-01-06 ENCOUNTER — Telehealth: Payer: Self-pay | Admitting: Medical

## 2018-01-09 NOTE — Telephone Encounter (Signed)
Pt is due for follow up please call and schedule appointment.  

## 2018-01-09 NOTE — Telephone Encounter (Signed)
Patient made appointment for 01/11/2018 @ 9:30 with Ramon DredgeEdward.

## 2018-01-11 ENCOUNTER — Ambulatory Visit: Payer: Self-pay | Admitting: Medical

## 2018-01-11 DIAGNOSIS — Z0289 Encounter for other administrative examinations: Secondary | ICD-10-CM
